# Patient Record
Sex: Male | Born: 1960 | Race: White | Hispanic: No | Smoking: Current every day smoker
Health system: Southern US, Community
[De-identification: ages and names within clinical notes are randomized; demographics above are authoritative.]

## PROBLEM LIST (undated history)

## (undated) DIAGNOSIS — I10 Essential (primary) hypertension: Secondary | ICD-10-CM

## (undated) DIAGNOSIS — F329 Major depressive disorder, single episode, unspecified: Secondary | ICD-10-CM

## (undated) DIAGNOSIS — I219 Acute myocardial infarction, unspecified: Secondary | ICD-10-CM

## (undated) DIAGNOSIS — F32A Depression, unspecified: Secondary | ICD-10-CM

## (undated) DIAGNOSIS — N19 Unspecified kidney failure: Secondary | ICD-10-CM

## (undated) DIAGNOSIS — E785 Hyperlipidemia, unspecified: Secondary | ICD-10-CM

## (undated) DIAGNOSIS — K859 Acute pancreatitis without necrosis or infection, unspecified: Secondary | ICD-10-CM

## (undated) DIAGNOSIS — F419 Anxiety disorder, unspecified: Secondary | ICD-10-CM

## (undated) DIAGNOSIS — I2510 Atherosclerotic heart disease of native coronary artery without angina pectoris: Secondary | ICD-10-CM

## (undated) DIAGNOSIS — E1149 Type 2 diabetes mellitus with other diabetic neurological complication: Secondary | ICD-10-CM

## (undated) HISTORY — DX: Anxiety disorder, unspecified: F41.9

## (undated) HISTORY — PX: CYSTOSCOPY W/ STONE MANIPULATION: SHX1427

## (undated) HISTORY — PX: CORONARY ANGIOPLASTY WITH STENT PLACEMENT: SHX49

## (undated) HISTORY — PX: CHOLECYSTECTOMY OPEN: SUR202

## (undated) HISTORY — DX: Type 2 diabetes mellitus with other diabetic neurological complication: E11.49

---

## 2015-10-23 ENCOUNTER — Emergency Department (HOSPITAL_COMMUNITY)
Admission: EM | Admit: 2015-10-23 | Discharge: 2015-10-23 | Disposition: A | Discharge status: Home / Self Care | Payer: Medicare Other | Attending: Emergency Medicine | Admitting: Emergency Medicine

## 2015-10-23 ENCOUNTER — Emergency Department (HOSPITAL_COMMUNITY): Payer: Medicare Other

## 2015-10-23 ENCOUNTER — Encounter (HOSPITAL_COMMUNITY): Payer: Self-pay | Admitting: *Deleted

## 2015-10-23 DIAGNOSIS — F17210 Nicotine dependence, cigarettes, uncomplicated: Secondary | ICD-10-CM | POA: Insufficient documentation

## 2015-10-23 DIAGNOSIS — I10 Essential (primary) hypertension: Secondary | ICD-10-CM | POA: Insufficient documentation

## 2015-10-23 DIAGNOSIS — R109 Unspecified abdominal pain: Secondary | ICD-10-CM | POA: Insufficient documentation

## 2015-10-23 DIAGNOSIS — Z794 Long term (current) use of insulin: Secondary | ICD-10-CM | POA: Diagnosis not present

## 2015-10-23 DIAGNOSIS — Z9119 Patient's noncompliance with other medical treatment and regimen: Secondary | ICD-10-CM | POA: Diagnosis not present

## 2015-10-23 DIAGNOSIS — E1165 Type 2 diabetes mellitus with hyperglycemia: Secondary | ICD-10-CM | POA: Insufficient documentation

## 2015-10-23 DIAGNOSIS — G629 Polyneuropathy, unspecified: Secondary | ICD-10-CM | POA: Insufficient documentation

## 2015-10-23 DIAGNOSIS — I252 Old myocardial infarction: Secondary | ICD-10-CM | POA: Insufficient documentation

## 2015-10-23 DIAGNOSIS — Z8719 Personal history of other diseases of the digestive system: Secondary | ICD-10-CM | POA: Insufficient documentation

## 2015-10-23 DIAGNOSIS — Z79899 Other long term (current) drug therapy: Secondary | ICD-10-CM | POA: Diagnosis not present

## 2015-10-23 DIAGNOSIS — Z9114 Patient's other noncompliance with medication regimen: Secondary | ICD-10-CM

## 2015-10-23 HISTORY — DX: Acute myocardial infarction, unspecified: I21.9

## 2015-10-23 HISTORY — DX: Acute pancreatitis without necrosis or infection, unspecified: K85.90

## 2015-10-23 HISTORY — DX: Essential (primary) hypertension: I10

## 2015-10-23 LAB — CBC
HEMATOCRIT: 38.9 % — AB (ref 39.0–52.0)
Hemoglobin: 14.3 g/dL (ref 13.0–17.0)
MCH: 30.1 pg (ref 26.0–34.0)
MCHC: 36.8 g/dL — ABNORMAL HIGH (ref 30.0–36.0)
MCV: 81.9 fL (ref 78.0–100.0)
PLATELETS: ADEQUATE 10*3/uL (ref 150–400)
RBC: 4.75 MIL/uL (ref 4.22–5.81)
RDW: 13 % (ref 11.5–15.5)
WBC: 5.1 10*3/uL (ref 4.0–10.5)

## 2015-10-23 LAB — BLOOD GAS, ARTERIAL
Acid-base deficit: 2 mmol/L (ref 0.0–2.0)
BICARBONATE: 23 meq/L (ref 20.0–24.0)
Drawn by: 331471
O2 SAT: 91.2 %
PATIENT TEMPERATURE: 98.6
PO2 ART: 62.7 mmHg — AB (ref 80.0–100.0)
TCO2: 20.8 mmol/L (ref 0–100)
pCO2 arterial: 42.6 mmHg (ref 35.0–45.0)
pH, Arterial: 7.352 (ref 7.350–7.450)

## 2015-10-23 LAB — URINALYSIS, ROUTINE W REFLEX MICROSCOPIC
Bilirubin Urine: NEGATIVE
Glucose, UA: >1000 mg/dL — AB
Hgb urine dipstick: NEGATIVE
KETONES UR: NEGATIVE mg/dL
LEUKOCYTES UA: NEGATIVE
NITRITE: NEGATIVE
PROTEIN: NEGATIVE mg/dL
Specific Gravity, Urine: 1.034 — ABNORMAL HIGH (ref 1.005–1.030)
pH: 6 (ref 5.0–8.0)

## 2015-10-23 LAB — BASIC METABOLIC PANEL
ANION GAP: 15 (ref 5–15)
BUN: 19 mg/dL (ref 6–20)
CALCIUM: 9 mg/dL (ref 8.9–10.3)
CO2: 18 mmol/L — ABNORMAL LOW (ref 22–32)
Chloride: 91 mmol/L — ABNORMAL LOW (ref 101–111)
Creatinine, Ser: 1.31 mg/dL — ABNORMAL HIGH (ref 0.61–1.24)
GFR, EST NON AFRICAN AMERICAN: 60 mL/min — AB (ref >60)
Glucose, Bld: 1065 mg/dL (ref 65–99)
POTASSIUM: 4.6 mmol/L (ref 3.5–5.1)
SODIUM: 124 mmol/L — AB (ref 135–145)

## 2015-10-23 LAB — CBG MONITORING, ED
GLUCOSE-CAPILLARY: 474 mg/dL — AB (ref 65–99)
Glucose-Capillary: >600 mg/dL (ref 65–99)
Glucose-Capillary: >600 mg/dL (ref 65–99)
Glucose-Capillary: 304 mg/dL — ABNORMAL HIGH (ref 65–99)

## 2015-10-23 LAB — LIPASE, BLOOD: Lipase: 16 U/L (ref 11–51)

## 2015-10-23 LAB — BETA-HYDROXYBUTYRIC ACID: Beta-Hydroxybutyric Acid: 0.3 mmol/L — ABNORMAL HIGH (ref 0.05–0.27)

## 2015-10-23 LAB — URINE MICROSCOPIC-ADD ON: RBC / HPF: NONE SEEN RBC/hpf (ref 0–5)

## 2015-10-23 LAB — TROPONIN I: Troponin I: <0.03 ng/mL (ref <0.031)

## 2015-10-23 MED ORDER — CARVEDILOL 3.125 MG PO TABS: 3.1250 mg | ORAL_TABLET | Freq: Two times a day (BID) | ORAL | Status: AC

## 2015-10-23 MED ORDER — NOVOLOG FLEXPEN 100 UNIT/ML ~~LOC~~ SOPN: PEN_INJECTOR | SUBCUTANEOUS | Status: DC

## 2015-10-23 MED ORDER — ONDANSETRON HCL 4 MG/2ML IJ SOLN
4.0000 mg | Freq: Once | INTRAMUSCULAR | Status: AC
  Administered 2015-10-23: 4 mg via INTRAVENOUS
  Filled 2015-10-23: qty 2

## 2015-10-23 MED ORDER — MORPHINE SULFATE (PF) 4 MG/ML IV SOLN
4.0000 mg | Freq: Once | INTRAVENOUS | Status: AC
  Administered 2015-10-23: 4 mg via INTRAVENOUS
  Filled 2015-10-23: qty 1

## 2015-10-23 MED ORDER — INSULIN ASPART 100 UNIT/ML IV SOLN
10.0000 [IU] | Freq: Once | INTRAVENOUS | Status: DC
  Filled 2015-10-23: qty 0.1

## 2015-10-23 MED ORDER — LANTUS SOLOSTAR 100 UNIT/ML ~~LOC~~ SOPN: 70.0000 [IU] | PEN_INJECTOR | Freq: Every day | SUBCUTANEOUS | Status: DC

## 2015-10-23 MED ORDER — PANTOPRAZOLE SODIUM 40 MG PO TBEC: 40.0000 mg | DELAYED_RELEASE_TABLET | Freq: Every day | ORAL | Status: AC

## 2015-10-23 MED ORDER — GABAPENTIN 300 MG PO CAPS: 900.0000 mg | ORAL_CAPSULE | Freq: Three times a day (TID) | ORAL | Status: AC

## 2015-10-23 MED ORDER — HYDROCODONE-ACETAMINOPHEN 5-325 MG PO TABS
1.0000 | ORAL_TABLET | Freq: Once | ORAL | Status: AC
  Administered 2015-10-23: 1 via ORAL
  Filled 2015-10-23: qty 1

## 2015-10-23 MED ORDER — FENOFIBRATE 145 MG PO TABS: 145.0000 mg | ORAL_TABLET | Freq: Every morning | ORAL | Status: AC

## 2015-10-23 MED ORDER — SODIUM CHLORIDE 0.9 % IV BOLUS (SEPSIS)
1000.0000 mL | Freq: Once | INTRAVENOUS | Status: AC
  Administered 2015-10-23: 1000 mL via INTRAVENOUS

## 2015-10-23 MED ORDER — SODIUM CHLORIDE 0.9 % IV SOLN
INTRAVENOUS | Status: DC
  Administered 2015-10-23: 5.4 [IU]/h via INTRAVENOUS
  Administered 2015-10-23: 10 [IU]/h via INTRAVENOUS
  Filled 2015-10-23: qty 2.5

## 2015-10-23 MED ORDER — INSULIN ASPART 100 UNIT/ML IV SOLN: 10.0000 [IU] | Freq: Once | INTRAVENOUS | Status: DC

## 2015-10-23 MED ORDER — ROSUVASTATIN CALCIUM 40 MG PO TABS: 40.0000 mg | ORAL_TABLET | Freq: Every day | ORAL | Status: AC

## 2015-10-23 MED ORDER — HYDROCODONE-ACETAMINOPHEN 5-325 MG PO TABS: 2.0000 | ORAL_TABLET | ORAL | Status: DC | PRN

## 2015-10-23 NOTE — ED Notes (Signed)
Bed: [id number] Expected date:  Expected time:  Means of arrival:  Comments: EMS- hyperglyemia > 600

## 2015-10-23 NOTE — ED Provider Notes (Signed)
CSN: [id number]     Arrival date & time 10/23/15  1419 History   First MD Initiated Contact with Patient 10/23/15 1510     Chief Complaint  Patient presents with  . Hyperglycemia   PT REPORTS HIGH BLOOD SUGAR.  PT SAID THAT HE JUST MOVED HERE A WEEK AGO AND DOES NOT HAVE A PCP.  THE PT SAID THAT HE FREQUENTLY HAS ELEVATED BS.  PT HAD BS OVER 600 AT HOME.  HE DID NOT TAKE ANY EXTRA MEDS, BUT DID SAY HE TOOK HIS MEDS TODAY.  PT ALSO C/O ABD PAIN WHICH IS CHRONIC.  NOTHING NEW ABOUT THE PAIN TODAY.  PT C/O PAIN IN HIS LEGS WHICH IS ALSO CHRONIC FROM NEUROPATHY.  PT ALSO C/O A WART ON HIS RIGHT FOOT THAT HE HAS HAD FOR 3 YEARS.  THIS IS ALSO CAUSING HIM CHRONIC PAIN.   (Consider location/radiation/quality/duration/timing/severity/associated sxs/prior Treatment) The history is provided by the patient.    Past Medical History  Diagnosis Date  . Diabetes mellitus without complication (HCC)   . Hypertension   . Myocardial infarction acute (HCC) 2015  . Pancreatitis    Past Surgical History  Procedure Laterality Date  . Cholecystectomy     No family history on file. Social History  Substance Use Topics  . Smoking status: Current Every Day Smoker    Types: Cigarettes  . Smokeless tobacco: None  . Alcohol Use: No    Review of Systems  Gastrointestinal: Positive for abdominal pain.  Endocrine: Positive for polydipsia and polyuria.  Musculoskeletal:       BILATERAL LEG PAIN (NEUROPATHY) AND RIGHT FOOT PAIN   All other systems reviewed and are negative.     Allergies  Review of patient's allergies indicates no known allergies.  Home Medications   Prior to Admission medications   Medication Sig Start Date End Date Taking? Authorizing Provider  carvedilol (COREG) 3.125 MG tablet Take 1 tablet (3.125 mg total) by mouth 2 (two) times daily. 10/23/15   Jacalyn Lefevre, MD  fenofibrate (TRICOR) 145 MG tablet Take 1 tablet (145 mg total) by mouth every morning. 10/23/15   Jacalyn Lefevre,  MD  gabapentin (NEURONTIN) 300 MG capsule Take 3 capsules (900 mg total) by mouth 3 (three) times daily. 10/23/15   Jacalyn Lefevre, MD  HYDROcodone-acetaminophen (NORCO/VICODIN) 5-325 MG tablet Take 2 tablets by mouth every 4 (four) hours as needed. 10/23/15   Jacalyn Lefevre, MD  LANTUS SOLOSTAR 100 UNIT/ML Solostar Pen Inject 70 Units into the skin daily at 10 pm. 10/23/15   Jacalyn Lefevre, MD  NOVOLOG FLEXPEN 100 UNIT/ML FlexPen USE AS RX'D BY PCP 10/23/15   Jacalyn Lefevre, MD  pantoprazole (PROTONIX) 40 MG tablet Take 1 tablet (40 mg total) by mouth at bedtime. 10/23/15   Jacalyn Lefevre, MD  rosuvastatin (CRESTOR) 40 MG tablet Take 1 tablet (40 mg total) by mouth at bedtime. 10/23/15   Jacalyn Lefevre, MD   BP 112/79 mmHg  Pulse 103  Temp(Src) 98.2 F (36.8 C) (Oral)  Resp 17  Ht  (1.93 m)  Wt 190 lb (86.183 kg)  BMI 23.14 kg/m2  SpO2 98% Physical Exam  Constitutional: He is oriented to person, place, and time. He appears well-developed and well-nourished.  HENT:  Head: Normocephalic and atraumatic.  Right Ear: External ear normal.  Left Ear: External ear normal.  Mouth/Throat: Mucous membranes are dry.  Eyes: Conjunctivae are normal. Pupils are equal, round, and reactive to light.  Neck: Normal range of motion. Neck supple.  Cardiovascular: Normal rate, regular rhythm and normal heart sounds.   Pulmonary/Chest: Effort normal and breath sounds normal.  Abdominal: Soft. There is tenderness.  Musculoskeletal: Normal range of motion.  Neurological: He is alert and oriented to person, place, and time.  Skin: Skin is warm and dry.  Psychiatric: He has a normal mood and affect. His behavior is normal. Thought content normal.  Nursing note and vitals reviewed.   ED Course  Procedures (including critical care time) Labs Review Labs Reviewed  BASIC METABOLIC PANEL - Abnormal; Notable for the following:    Sodium 124 (*)    Chloride 91 (*)    CO2 18 (*)    Glucose, Bld 1065 (*)     Creatinine, Ser 1.31 (*)    GFR calc non Af Amer 60 (*)    All other components within normal limits  URINALYSIS, ROUTINE W REFLEX MICROSCOPIC (NOT AT [facility]) - Abnormal; Notable for the following:    Specific Gravity, Urine 1.034 (*)    Glucose, UA >1000 (*)    All other components within normal limits  BLOOD GAS, ARTERIAL - Abnormal; Notable for the following:    pO2, Arterial 62.7 (*)    All other components within normal limits  BETA-HYDROXYBUTYRIC ACID - Abnormal; Notable for the following:    Beta-Hydroxybutyric Acid 0.30 (*)    All other components within normal limits  URINE MICROSCOPIC-ADD ON - Abnormal; Notable for the following:    Squamous Epithelial / LPF 0-5 (*)    Bacteria, UA RARE (*)    All other components within normal limits  CBC - Abnormal; Notable for the following:    HCT 38.9 (*)    MCHC 36.8 (*)    All other components within normal limits  CBG MONITORING, ED - Abnormal; Notable for the following:    Glucose-Capillary >600 (*)    All other components within normal limits  CBG MONITORING, ED - Abnormal; Notable for the following:    Glucose-Capillary >600 (*)    All other components within normal limits  CBG MONITORING, ED - Abnormal; Notable for the following:    Glucose-Capillary >600 (*)    All other components within normal limits  CBG MONITORING, ED - Abnormal; Notable for the following:    Glucose-Capillary 474 (*)    All other components within normal limits  CBG MONITORING, ED - Abnormal; Notable for the following:    Glucose-Capillary 304 (*)    All other components within normal limits  LIPASE, BLOOD  TROPONIN I    Imaging Review Dg Abd Acute W/chest  10/23/2015  CLINICAL DATA:  Increasing right lower quadrant pain. EXAM: DG ABDOMEN ACUTE W/ 1V CHEST COMPARISON:  None. FINDINGS: Surgical clips and coils near the midline of the upper abdomen. Moderate stool burden throughout the colon. Nonobstructive bowel gas pattern. No free air organomegaly.  No suspicious calcification. Heart and mediastinal contours are within normal limits. No focal opacities or effusions. No acute bony abnormality. IMPRESSION: Moderate stool burden.  No obstruction or free air. No active cardiopulmonary disease. Electronically Signed   By: Charlett NoseKevin  Dover M.D.   On: 10/23/2015 15:58   I have personally reviewed and evaluated these images and lab results as part of my medical decision-making.   EKG Interpretation   Date/Time:  Wednesday October 23 2015 16:03:29 EDT Ventricular Rate:  70 PR Interval:  163 QRS Duration: 110 QT Interval:  410 QTC Calculation: 442 R Axis:   79 Text Interpretation:  Sinus rhythm Anterior infarct,  old Minimal ST  depression, lateral leads Confirmed by [facility] MD, Maybell Misenheimer ([id number]) on  10/23/2015 4:23:42 PM      MDM  BS MUCH BETTER.  PT FEELS BETTER. Final diagnoses:  Type 2 diabetes mellitus with hyperglycemia, with long-term current use of insulin (HCC)  Noncompliance with medications        Jacalyn Lefevre, MD 10/23/15 [phone]

## 2015-10-23 NOTE — ED Notes (Signed)
Patient is from home. He called 911 today because he was not feeling well. Patient has a diagnosis of DM, but is poorly educated in regards to insulin and the disease process. Patient says his blood sugar was almost 600 last night, he took his insulin, but then ate a bowl of cherrios. Today he just still feels really, bad with abdominal pain. He read 600 on the EMS meter, but is likely higher.
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
## 2015-10-23 NOTE — ED Notes (Signed)
RT at bedside for blood gas

## 2015-10-23 NOTE — Discharge Instructions (Signed)
Blood Glucose Monitoring, Adult ° °Monitoring your blood glucose (also know as blood sugar) helps you to manage your diabetes. It also helps you and your health care provider monitor your diabetes and determine how well your treatment plan is working. °WHY SHOULD YOU MONITOR YOUR BLOOD GLUCOSE? °· It can help you understand how food, exercise, and medicine affect your blood glucose. °· It allows you to know what your blood glucose is at any given moment. You can quickly tell if you are having low blood glucose (hypoglycemia) or high blood glucose (hyperglycemia). °· It can help you and your health care provider know how to adjust your medicines. °· It can help you understand how to manage an illness or adjust medicine for exercise. °WHEN SHOULD YOU TEST? °Your health care provider will help you decide how often you should check your blood glucose. This may depend on the type of diabetes you have, your diabetes control, or the types of medicines you are taking. Be sure to write down all of your blood glucose readings so that this information can be reviewed with your health care provider. See below for examples of testing times that your health care provider may suggest. °Type 1 Diabetes °· Test at least 2 times per day if your diabetes is well controlled, if you are using an insulin pump, or if you perform multiple daily injections. °· If your diabetes is not well controlled or if you are sick, you may need to test more often. °· It is a good idea to also test: °¨ Before every insulin injection. °¨ Before and after exercise. °¨ Between meals and 2 hours after a meal. °¨ Occasionally between 2:00 a.m. and 3:00 a.m. °Type 2 Diabetes °· If you are taking insulin, test at least 2 times per day. However, it is best to test before every insulin injection. °· If you take medicines by mouth (orally), test 2 times a day. °· If you are on a controlled diet, test once a day. °· If your diabetes is not well controlled or if you  are sick, you may need to monitor more often. °HOW TO MONITOR YOUR BLOOD GLUCOSE °Supplies Needed °· Blood glucose meter. °· Test strips for your meter. Each meter has its own strips. You must use the strips that go with your own meter. °· A pricking needle (lancet). °· A device that holds the lancet (lancing device). °· A journal or log book to write down your results. °Procedure °· Wash your hands with soap and water. Alcohol is not preferred. °· Prick the side of your finger (not the tip) with the lancet. °· Gently milk the finger until a small drop of blood appears. °· Follow the instructions that come with your meter for inserting the test strip, applying blood to the strip, and using your blood glucose meter. °Other Areas to Get Blood for Testing °Some meters allow you to use other areas of your body (other than your finger) to test your blood. These areas are called alternative sites. The most common alternative sites are: °· The forearm. °· The thigh. °· The back area of the lower leg. °· The palm of the hand. °The blood flow in these areas is slower. Therefore, the blood glucose values you get may be delayed, and the numbers are different from what you would get from your fingers. Do not use alternative sites if you think you are having hypoglycemia. Your reading will not be accurate. Always use a finger if you   are having hypoglycemia. Also, if you cannot feel your lows (hypoglycemia unawareness), always use your fingers for your blood glucose checks. °ADDITIONAL TIPS FOR GLUCOSE MONITORING °· Do not reuse lancets. °· Always carry your supplies with you. °· All blood glucose meters have a 24-hour "hotline" number to call if you have questions or need help. °· Adjust (calibrate) your blood glucose meter with a control solution after finishing a few boxes of strips. °BLOOD GLUCOSE RECORD KEEPING °It is a good idea to keep a daily record or log of your blood glucose readings. Most glucose meters, if not all,  keep your glucose records stored in the meter. Some meters come with the ability to download your records to your home computer. Keeping a record of your blood glucose readings is especially helpful if you are wanting to look for patterns. Make notes to go along with the blood glucose readings because you might forget what happened at that exact time. Keeping good records helps you and your health care provider to work together to achieve good diabetes management.  °  °This information is not intended to replace advice given to you by your health care provider. Make sure you discuss any questions you have with your health care provider. °  °Document Released: 06/18/2003 Document Revised: 07/06/2014 Document Reviewed: 11/07/2012 °Elsevier Interactive Patient Education ©2016 Elsevier Inc. ° °

## 2015-10-23 NOTE — ED Notes (Addendum)
Chris from lab critical glucose 1065. Primary nurse notified of same.

## 2015-10-23 NOTE — ED Notes (Signed)
Sarah from lab, reports "platelet appears adequate".

## 2015-10-24 ENCOUNTER — Telehealth: Payer: Self-pay

## 2015-10-24 NOTE — Telephone Encounter (Signed)
[facility]:  This Case Manager received communication from Radford PaxAmy Ferrero, RN CM [facility] indicating patient new to the area and needing an ED follow-up appointment for type 2 diabetes mellitus with hyperglycemia. Call placed to #[phone]; unable to reach patient. Also unable to leave voicemail as recording indicated "subscriber not available."

## 2015-10-25 ENCOUNTER — Telehealth: Payer: Self-pay | Admitting: General Practice

## 2015-11-04 ENCOUNTER — Telehealth: Payer: Self-pay
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
## 2015-11-05 ENCOUNTER — Telehealth: Payer: Self-pay

## 2015-11-05 NOTE — Telephone Encounter (Signed)
Attempted again to reach the patient to inquire if he needs transportation [facility].  Call placed to # [phone] and the message states that the phone does not accept incoming calls at the subscriber's request.

## 2015-11-05 NOTE — Telephone Encounter (Signed)
Attempted to contact the patient to confirm his appointment and need for cab transportation to the clinic. Calls placed to # [phone]. Twice the call was dropped after 7 rings and then the third time the message stated that " the subscriber is not accepting incoming calls ."

## 2015-11-06 ENCOUNTER — Encounter: Payer: Self-pay | Admitting: Internal Medicine

## 2015-11-06 ENCOUNTER — Ambulatory Visit: Payer: Medicare (Managed Care) | Attending: Internal Medicine | Admitting: Internal Medicine

## 2015-11-06 ENCOUNTER — Other Ambulatory Visit: Payer: Self-pay | Admitting: *Deleted

## 2015-11-06 VITALS — BP 115/79 | HR 94 | Temp 98.3°F | Resp 18 | Ht 76.0 in | Wt 191.0 lb

## 2015-11-06 DIAGNOSIS — G8929 Other chronic pain: Secondary | ICD-10-CM | POA: Insufficient documentation

## 2015-11-06 DIAGNOSIS — Z9119 Patient's noncompliance with other medical treatment and regimen: Secondary | ICD-10-CM | POA: Insufficient documentation

## 2015-11-06 DIAGNOSIS — IMO0002 Reserved for concepts with insufficient information to code with codable children: Secondary | ICD-10-CM | POA: Insufficient documentation

## 2015-11-06 DIAGNOSIS — Z794 Long term (current) use of insulin: Secondary | ICD-10-CM | POA: Diagnosis not present

## 2015-11-06 DIAGNOSIS — E119 Type 2 diabetes mellitus without complications: Secondary | ICD-10-CM

## 2015-11-06 DIAGNOSIS — I10 Essential (primary) hypertension: Secondary | ICD-10-CM | POA: Insufficient documentation

## 2015-11-06 DIAGNOSIS — B070 Plantar wart: Secondary | ICD-10-CM | POA: Insufficient documentation

## 2015-11-06 DIAGNOSIS — E1165 Type 2 diabetes mellitus with hyperglycemia: Secondary | ICD-10-CM | POA: Diagnosis not present

## 2015-11-06 DIAGNOSIS — F411 Generalized anxiety disorder: Secondary | ICD-10-CM | POA: Insufficient documentation

## 2015-11-06 DIAGNOSIS — G629 Polyneuropathy, unspecified: Secondary | ICD-10-CM | POA: Insufficient documentation

## 2015-11-06 DIAGNOSIS — I252 Old myocardial infarction: Secondary | ICD-10-CM | POA: Diagnosis not present

## 2015-11-06 DIAGNOSIS — E1142 Type 2 diabetes mellitus with diabetic polyneuropathy: Secondary | ICD-10-CM

## 2015-11-06 DIAGNOSIS — R1031 Right lower quadrant pain: Secondary | ICD-10-CM | POA: Diagnosis not present

## 2015-11-06 DIAGNOSIS — M792 Neuralgia and neuritis, unspecified: Secondary | ICD-10-CM | POA: Insufficient documentation

## 2015-11-06 LAB — GLUCOSE, POCT (MANUAL RESULT ENTRY)
POC GLUCOSE: 290 mg/dL — AB (ref 70–99)
POC Glucose: 193 mg/dl — AB (ref 70–99)

## 2015-11-06 LAB — HEMOGLOBIN A1C
HEMOGLOBIN A1C: 14.4 % — AB (ref <5.7)
MEAN PLASMA GLUCOSE: 367 mg/dL

## 2015-11-06 MED ORDER — AMITRIPTYLINE HCL 25 MG PO TABS: 25.0000 mg | ORAL_TABLET | Freq: Every day | ORAL | Status: DC

## 2015-11-06 MED ORDER — AMITRIPTYLINE HCL 25 MG PO TABS: 25.0000 mg | ORAL_TABLET | Freq: Every day | ORAL | Status: AC

## 2015-11-06 MED ORDER — INSULIN ASPART 100 UNIT/ML ~~LOC~~ SOLN
10.0000 [IU] | Freq: Once | SUBCUTANEOUS | Status: AC
  Administered 2015-11-06: 10 [IU] via SUBCUTANEOUS

## 2015-11-06 MED ORDER — NOVOLOG FLEXPEN 100 UNIT/ML ~~LOC~~ SOPN: 20.0000 [IU] | PEN_INJECTOR | Freq: Three times a day (TID) | SUBCUTANEOUS | Status: DC

## 2015-11-06 NOTE — Assessment & Plan Note (Signed)
Reports residual pain symptoms from 2015 necrotizing (life threatening) pancreatitis Treatment as under periph neuropathy pain No other acute symptoms or GI symptoms such as N/V/D

## 2015-11-06 NOTE — Progress Notes (Signed)
Subjective:    Patient ID: Devin Wells, male    DOB: December 23, 1960, 55 y.o.   MRN: [id number]  HPI  Patient here for ED follow up and establish care [place], [place] with chronic medical conditions : DM, neuropathy, chronic pain in pain clinic (out of oxy x 3 weeks, prev  TID) Reports no change in chronic severe B leg/feet pain or RLQ pain since ED visit Out of all meds, not jut narcotics  Past Medical History  Diagnosis Date  . Diabetes with neurologic complications (HCC)     neuropathy  . Hypertension   . Myocardial infarction acute (HCC) 2015  . Pancreatitis   . Anxiety     Review of Systems  Constitutional: Positive for fatigue. Negative for fever, appetite change and unexpected weight change.  Respiratory: Negative for cough and shortness of breath.   Cardiovascular: Positive for leg swelling (intermittent, none today). Negative for chest pain and palpitations.  Gastrointestinal: Positive for abdominal pain (chronic RLQ). Negative for nausea and constipation. Diarrhea: occ.  Musculoskeletal: Positive for myalgias.  Neurological: Positive for numbness.       Objective:    Physical Exam  Constitutional: He is oriented to person, place, and time. He appears well-developed and well-nourished.  Anxious demeanor - son at side  Cardiovascular: Normal rate, regular rhythm and normal heart sounds.   Pulmonary/Chest: Effort normal and breath sounds normal.  Musculoskeletal: He exhibits no edema.  Neurological: He is alert and oriented to person, place, and time.  Skin: Skin is warm. No rash noted. No erythema.  L foot under MT pad with small 2mm wart - no other wound/ulcers/bunion or precallus  Psychiatric:  anxious    BP 115/79 mmHg  Pulse 94  Temp(Src) 98.3 F (36.8 C) (Oral)  Resp 18  Ht  (1.93 m)  Wt 191 lb (86.637 kg)  BMI 23.26 kg/m2  SpO2 97% Wt Readings from Last 3 Encounters:  11/06/15 191 lb  (86.637 kg)  10/23/15 190 lb (86.183 kg)     Lab Results  Component Value Date   WBC 5.1 10/23/2015   HGB 14.3 10/23/2015   HCT 38.9* 10/23/2015   PLT PLATELETS APPEAR ADEQUATE 10/23/2015   GLUCOSE 1065* 10/23/2015   NA 124* 10/23/2015   K 4.6 10/23/2015   CL 91* 10/23/2015   CREATININE 1.31* 10/23/2015   BUN 19 10/23/2015   CO2 18* 10/23/2015    Dg Abd Acute W/chest  10/23/2015  CLINICAL DATA:  Increasing right lower quadrant pain. EXAM: DG ABDOMEN ACUTE W/ 1V CHEST COMPARISON:  None. FINDINGS: Surgical clips and coils near the midline of the upper abdomen. Moderate stool burden throughout the colon. Nonobstructive bowel gas pattern. No free air organomegaly. No suspicious calcification. Heart and mediastinal contours are within normal limits. No focal opacities or effusions. No acute bony abnormality. IMPRESSION: Moderate stool burden.  No obstruction or free air. No active cardiopulmonary disease. Electronically Signed   By: Charlett Nose M.D.   On: 10/23/2015 15:58       Assessment & Plan:   Problem List Items Addressed This Visit    Abdominal pain, chronic, right lower quadrant    Reports residual pain symptoms from 2015 necrotizing (life threatening) pancreatitis Treatment as under periph neuropathy pain No other acute symptoms or GI symptoms such as N/V/D      Relevant Orders   Ambulatory referral to Pain Clinic   Diabetes type 2, uncontrolled (HCC) -  Primary    Complicated by noncompliance - ED visit with glc >1000 Instructed to take medications as listed to establish "baseline trends" before trying to make dose adjustments Refill on meds today - reports already has meter and supplies Check a1c today Refer to DM counseling and RTC 3 mo for recheck No results found for: HGBA1C       Relevant Medications   insulin aspart (novoLOG) injection 10 Units (Completed)   NOVOLOG FLEXPEN 100 UNIT/ML FlexPen   Other Relevant Orders   Glucose (CBG) (Completed)   Glucose  (CBG) (Completed)   Ambulatory referral to Podiatry   Generalized anxiety disorder    Exacerbated by "withdrawl" from oxy (none in past 3 weeks) Use amitrip qhs and behav support - now living with son rather than alone in [place] which pt reports is helping      Peripheral neuropathic pain (HCC)    Severe BLE symptoms - previously in pain clinic in [place] for this and chronic RLQ pain s/p nec panc in 2015 Continue gaba 900 TID and add Elavil 25mg  qhs Refer back to pain clinic to consider resuming Oxy as needed (prev 30mg  TID which will not be managed or refilled at this clinic)      Relevant Orders   Ambulatory referral to Pain Clinic   Ambulatory referral to Podiatry   Plantar wart of left foot    Refer podiatry given pain associated with same and uncontrolled DM      Relevant Orders   Ambulatory referral to Podiatry    Other Visit Diagnoses    Type 2 diabetes mellitus without complication, unspecified long term insulin use status (HCC)        Relevant Medications    insulin aspart (novoLOG) injection 10 Units (Completed)    NOVOLOG FLEXPEN 100 UNIT/ML FlexPen    Other Relevant Orders    Hemoglobin A1c        Rene PaciValerie Bohdi Leeds, MD

## 2015-11-06 NOTE — Progress Notes (Signed)
Patient is here for HFU: DM  Patient complains of pain in the abdomen scaled currently at a 10. Patient has Diabetic Neuropathy which is a constant pain and increases at night.  Patient has not taken medication today and patient has not eaten.  Patient tolerated 10 units of insulin well today.

## 2015-11-06 NOTE — Assessment & Plan Note (Signed)
Complicated by noncompliance - ED visit with glc >1000 Instructed to take medications as listed to establish "baseline trends" before trying to make dose adjustments Refill on meds today - reports already has meter and supplies Check a1c today Refer to DM counseling and RTC 3 mo for recheck No results found for: HGBA1C

## 2015-11-06 NOTE — Assessment & Plan Note (Signed)
Refer podiatry given pain associated with same and uncontrolled DM

## 2015-11-06 NOTE — Patient Instructions (Signed)
It was good to see you today.  We have reviewed your prior records including labs and tests today  Test(s) ordered today. Your results will be released to MyChart (or called to you) after review, usually within 72hours after test completion. If any changes need to be made, you will be notified at that same time.  Medications reviewed and updated Start amitriptyline 25mg  at night for pain in addition to your other medications - no other changes recommended at this time.  This clinic will not fill your narcotics so will refer to pain clinic as requested. Our office will contact you regarding appointment(s) once made.  we'll make referral to podiatry for your wart pain and names given for dentist . Our office will contact you regarding appointment(s) once made.  Please schedule followup in 3-4 months for diabetes mellitus recheck, call sooner if problems.

## 2015-11-06 NOTE — Assessment & Plan Note (Signed)
Exacerbated by "withdrawl" from oxy (none in past 3 weeks) Use amitrip qhs and behav support - now living with son rather than alone in [place] which pt reports is helping

## 2015-11-06 NOTE — Assessment & Plan Note (Signed)
Severe BLE symptoms - previously in pain clinic in [place] for this and chronic RLQ pain s/p nec panc in 2015 Continue gaba 900 TID and add Elavil 25mg  qhs Refer back to pain clinic to consider resuming Oxy as needed (prev 30mg  TID which will not be managed or refilled at this clinic)

## 2015-11-14 ENCOUNTER — Telehealth: Payer: Self-pay | Admitting: Internal Medicine

## 2015-11-14 NOTE — Telephone Encounter (Signed)
Pt is calling regarding a refill of Amtriptyline 25 mg  Patient use [facility] at [address]  Thank you

## 2015-11-19 ENCOUNTER — Observation Stay (HOSPITAL_COMMUNITY)
Admission: EM | Admit: 2015-11-19 | Discharge: 2015-11-21 | Disposition: A | Discharge status: Home / Self Care | Payer: Medicare Other | Attending: Internal Medicine | Admitting: Internal Medicine

## 2015-11-19 ENCOUNTER — Telehealth: Payer: Self-pay | Admitting: Internal Medicine

## 2015-11-19 ENCOUNTER — Encounter (HOSPITAL_COMMUNITY): Payer: Self-pay | Admitting: Emergency Medicine

## 2015-11-19 DIAGNOSIS — D696 Thrombocytopenia, unspecified: Secondary | ICD-10-CM | POA: Insufficient documentation

## 2015-11-19 DIAGNOSIS — I2510 Atherosclerotic heart disease of native coronary artery without angina pectoris: Secondary | ICD-10-CM | POA: Insufficient documentation

## 2015-11-19 DIAGNOSIS — Z955 Presence of coronary angioplasty implant and graft: Secondary | ICD-10-CM | POA: Diagnosis not present

## 2015-11-19 DIAGNOSIS — E039 Hypothyroidism, unspecified: Secondary | ICD-10-CM | POA: Diagnosis present

## 2015-11-19 DIAGNOSIS — I10 Essential (primary) hypertension: Secondary | ICD-10-CM | POA: Diagnosis not present

## 2015-11-19 DIAGNOSIS — E785 Hyperlipidemia, unspecified: Secondary | ICD-10-CM | POA: Diagnosis not present

## 2015-11-19 DIAGNOSIS — IMO0002 Reserved for concepts with insufficient information to code with codable children: Secondary | ICD-10-CM | POA: Diagnosis present

## 2015-11-19 DIAGNOSIS — R0789 Other chest pain: Secondary | ICD-10-CM | POA: Diagnosis not present

## 2015-11-19 DIAGNOSIS — I252 Old myocardial infarction: Secondary | ICD-10-CM | POA: Insufficient documentation

## 2015-11-19 DIAGNOSIS — E1165 Type 2 diabetes mellitus with hyperglycemia: Secondary | ICD-10-CM | POA: Diagnosis present

## 2015-11-19 DIAGNOSIS — G629 Polyneuropathy, unspecified: Secondary | ICD-10-CM | POA: Diagnosis present

## 2015-11-19 DIAGNOSIS — D649 Anemia, unspecified: Secondary | ICD-10-CM | POA: Diagnosis not present

## 2015-11-19 DIAGNOSIS — E781 Pure hyperglyceridemia: Secondary | ICD-10-CM | POA: Diagnosis not present

## 2015-11-19 DIAGNOSIS — Z794 Long term (current) use of insulin: Secondary | ICD-10-CM | POA: Diagnosis not present

## 2015-11-19 DIAGNOSIS — F411 Generalized anxiety disorder: Secondary | ICD-10-CM | POA: Diagnosis not present

## 2015-11-19 DIAGNOSIS — F1210 Cannabis abuse, uncomplicated: Secondary | ICD-10-CM | POA: Insufficient documentation

## 2015-11-19 DIAGNOSIS — M792 Neuralgia and neuritis, unspecified: Secondary | ICD-10-CM

## 2015-11-19 DIAGNOSIS — R079 Chest pain, unspecified: Secondary | ICD-10-CM | POA: Diagnosis present

## 2015-11-19 DIAGNOSIS — R739 Hyperglycemia, unspecified: Secondary | ICD-10-CM

## 2015-11-19 DIAGNOSIS — Z9861 Coronary angioplasty status: Secondary | ICD-10-CM

## 2015-11-19 DIAGNOSIS — Z720 Tobacco use: Secondary | ICD-10-CM | POA: Diagnosis present

## 2015-11-19 DIAGNOSIS — E1140 Type 2 diabetes mellitus with diabetic neuropathy, unspecified: Secondary | ICD-10-CM | POA: Diagnosis not present

## 2015-11-19 DIAGNOSIS — K8591 Acute pancreatitis with uninfected necrosis, unspecified: Secondary | ICD-10-CM | POA: Diagnosis present

## 2015-11-19 DIAGNOSIS — Z8719 Personal history of other diseases of the digestive system: Secondary | ICD-10-CM

## 2015-11-19 DIAGNOSIS — F17210 Nicotine dependence, cigarettes, uncomplicated: Secondary | ICD-10-CM | POA: Insufficient documentation

## 2015-11-19 HISTORY — DX: Major depressive disorder, single episode, unspecified: F32.9

## 2015-11-19 HISTORY — DX: Atherosclerotic heart disease of native coronary artery without angina pectoris: I25.10

## 2015-11-19 HISTORY — DX: Depression, unspecified: F32.A

## 2015-11-19 HISTORY — DX: Hyperlipidemia, unspecified: E78.5

## 2015-11-19 HISTORY — DX: Unspecified kidney failure: N19

## 2015-11-19 LAB — CBG MONITORING, ED: Glucose-Capillary: 537 mg/dL — ABNORMAL HIGH (ref 65–99)

## 2015-11-19 MED ORDER — SODIUM CHLORIDE 0.9 % IV BOLUS (SEPSIS)
1000.0000 mL | Freq: Once | INTRAVENOUS | Status: AC
  Administered 2015-11-20: 1000 mL via INTRAVENOUS

## 2015-11-19 MED ORDER — HYDROMORPHONE HCL 1 MG/ML IJ SOLN
1.0000 mg | Freq: Once | INTRAMUSCULAR | Status: AC
  Administered 2015-11-20: 1 mg via INTRAVENOUS
  Filled 2015-11-19: qty 1

## 2015-11-19 MED ORDER — KETOROLAC TROMETHAMINE 30 MG/ML IJ SOLN
30.0000 mg | Freq: Once | INTRAMUSCULAR | Status: AC
  Administered 2015-11-20: 30 mg via INTRAVENOUS
  Filled 2015-11-19: qty 1

## 2015-11-19 MED ORDER — ONDANSETRON HCL 4 MG/2ML IJ SOLN
4.0000 mg | Freq: Once | INTRAMUSCULAR | Status: AC
  Administered 2015-11-20: 4 mg via INTRAVENOUS
  Filled 2015-11-19: qty 2

## 2015-11-19 NOTE — ED Notes (Signed)
Pt reports CP ongoing all day, worse intermittently, described as pressure. Worse with exertion, also noted SHOB with exertion. 324MG  ASA and 3SL ntrio on board. Hx MI and stent placement. Also c/o feet neuropathy and hyperglycemia.

## 2015-11-19 NOTE — Telephone Encounter (Signed)
Pt saw  Dr Felicity CoyerLeschber and he is requesting  Refill  of Amtriptyline 25 mg Patient use [facility] at [address]  Thank you

## 2015-11-20 ENCOUNTER — Encounter (HOSPITAL_COMMUNITY): Payer: Self-pay | Admitting: Internal Medicine

## 2015-11-20 ENCOUNTER — Emergency Department (HOSPITAL_COMMUNITY): Payer: Medicare Other

## 2015-11-20 ENCOUNTER — Observation Stay (HOSPITAL_BASED_OUTPATIENT_CLINIC_OR_DEPARTMENT_OTHER): Payer: Medicare Other

## 2015-11-20 DIAGNOSIS — Z794 Long term (current) use of insulin: Secondary | ICD-10-CM

## 2015-11-20 DIAGNOSIS — R079 Chest pain, unspecified: Secondary | ICD-10-CM | POA: Diagnosis not present

## 2015-11-20 DIAGNOSIS — I2510 Atherosclerotic heart disease of native coronary artery without angina pectoris: Secondary | ICD-10-CM

## 2015-11-20 DIAGNOSIS — E1142 Type 2 diabetes mellitus with diabetic polyneuropathy: Secondary | ICD-10-CM | POA: Diagnosis not present

## 2015-11-20 DIAGNOSIS — Z720 Tobacco use: Secondary | ICD-10-CM

## 2015-11-20 DIAGNOSIS — E039 Hypothyroidism, unspecified: Secondary | ICD-10-CM | POA: Diagnosis present

## 2015-11-20 DIAGNOSIS — Z9861 Coronary angioplasty status: Secondary | ICD-10-CM

## 2015-11-20 DIAGNOSIS — K8591 Acute pancreatitis with uninfected necrosis, unspecified: Secondary | ICD-10-CM | POA: Diagnosis present

## 2015-11-20 DIAGNOSIS — Z8719 Personal history of other diseases of the digestive system: Secondary | ICD-10-CM

## 2015-11-20 DIAGNOSIS — I10 Essential (primary) hypertension: Secondary | ICD-10-CM | POA: Diagnosis not present

## 2015-11-20 DIAGNOSIS — E785 Hyperlipidemia, unspecified: Secondary | ICD-10-CM | POA: Diagnosis present

## 2015-11-20 DIAGNOSIS — E1165 Type 2 diabetes mellitus with hyperglycemia: Secondary | ICD-10-CM

## 2015-11-20 DIAGNOSIS — F411 Generalized anxiety disorder: Secondary | ICD-10-CM

## 2015-11-20 LAB — CBC WITH DIFFERENTIAL/PLATELET
BASOS PCT: 0 %
Basophils Absolute: 0 10*3/uL (ref 0.0–0.1)
EOS ABS: 0 10*3/uL (ref 0.0–0.7)
EOS PCT: 1 %
HCT: 35.3 % — ABNORMAL LOW (ref 39.0–52.0)
HEMOGLOBIN: 11.7 g/dL — AB (ref 13.0–17.0)
Lymphocytes Relative: 21 %
Lymphs Abs: 0.9 10*3/uL (ref 0.7–4.0)
MCH: 29.5 pg (ref 26.0–34.0)
MCHC: 33.1 g/dL (ref 30.0–36.0)
MCV: 88.9 fL (ref 78.0–100.0)
MONOS PCT: 8 %
Monocytes Absolute: 0.4 10*3/uL (ref 0.1–1.0)
NEUTROS PCT: 70 %
Neutro Abs: 3.1 10*3/uL (ref 1.7–7.7)
PLATELETS: 126 10*3/uL — AB (ref 150–400)
RBC: 3.97 MIL/uL — AB (ref 4.22–5.81)
RDW: 14.4 % (ref 11.5–15.5)
WBC: 4.5 10*3/uL (ref 4.0–10.5)

## 2015-11-20 LAB — BASIC METABOLIC PANEL
ANION GAP: 11 (ref 5–15)
Anion gap: 9 (ref 5–15)
BUN: 14 mg/dL (ref 6–20)
BUN: 15 mg/dL (ref 6–20)
CALCIUM: 8.8 mg/dL — AB (ref 8.9–10.3)
CO2: 21 mmol/L — ABNORMAL LOW (ref 22–32)
CO2: 23 mmol/L (ref 22–32)
CREATININE: 0.9 mg/dL (ref 0.61–1.24)
Calcium: 8.5 mg/dL — ABNORMAL LOW (ref 8.9–10.3)
Chloride: 103 mmol/L (ref 101–111)
Chloride: 98 mmol/L — ABNORMAL LOW (ref 101–111)
Creatinine, Ser: 0.99 mg/dL (ref 0.61–1.24)
GFR calc Af Amer: >60 mL/min (ref >60)
GFR calc non Af Amer: >60 mL/min (ref >60)
Glucose, Bld: 201 mg/dL — ABNORMAL HIGH (ref 65–99)
Glucose, Bld: 524 mg/dL — ABNORMAL HIGH (ref 65–99)
POTASSIUM: 3.7 mmol/L (ref 3.5–5.1)
Potassium: 3.6 mmol/L (ref 3.5–5.1)
SODIUM: 130 mmol/L — AB (ref 135–145)
SODIUM: 135 mmol/L (ref 135–145)

## 2015-11-20 LAB — HEPATIC FUNCTION PANEL
ALBUMIN: 3.1 g/dL — AB (ref 3.5–5.0)
ALK PHOS: 67 U/L (ref 38–126)
ALT: 19 U/L (ref 17–63)
AST: 23 U/L (ref 15–41)
Bilirubin, Direct: <0.1 mg/dL — ABNORMAL LOW (ref 0.1–0.5)
TOTAL PROTEIN: 5.4 g/dL — AB (ref 6.5–8.1)
Total Bilirubin: 0.5 mg/dL (ref 0.3–1.2)

## 2015-11-20 LAB — I-STAT TROPONIN, ED
Troponin i, poc: 0 ng/mL (ref 0.00–0.08)
Troponin i, poc: 0 ng/mL (ref 0.00–0.08)

## 2015-11-20 LAB — RAPID URINE DRUG SCREEN, HOSP PERFORMED
Amphetamines: NOT DETECTED
BARBITURATES: NOT DETECTED
Benzodiazepines: NOT DETECTED
Cocaine: NOT DETECTED
Opiates: NOT DETECTED
TETRAHYDROCANNABINOL: POSITIVE — AB

## 2015-11-20 LAB — GLUCOSE, CAPILLARY
Glucose-Capillary: 185 mg/dL — ABNORMAL HIGH (ref 65–99)
Glucose-Capillary: 175 mg/dL — ABNORMAL HIGH (ref 65–99)

## 2015-11-20 LAB — CBC
HEMATOCRIT: 34 % — AB (ref 39.0–52.0)
HEMOGLOBIN: 11.3 g/dL — AB (ref 13.0–17.0)
MCH: 29.5 pg (ref 26.0–34.0)
MCHC: 33.2 g/dL (ref 30.0–36.0)
MCV: 88.8 fL (ref 78.0–100.0)
Platelets: 123 10*3/uL — ABNORMAL LOW (ref 150–400)
RBC: 3.83 MIL/uL — ABNORMAL LOW (ref 4.22–5.81)
RDW: 14.3 % (ref 11.5–15.5)
WBC: 5.9 10*3/uL (ref 4.0–10.5)

## 2015-11-20 LAB — BETA-HYDROXYBUTYRIC ACID: Beta-Hydroxybutyric Acid: 0.11 mmol/L (ref 0.05–0.27)

## 2015-11-20 LAB — TROPONIN I
Troponin I: <0.03 ng/mL (ref <0.031)
Troponin I: <0.03 ng/mL (ref <0.031)

## 2015-11-20 LAB — LIPASE, BLOOD: LIPASE: 11 U/L (ref 11–51)

## 2015-11-20 LAB — ECHOCARDIOGRAM COMPLETE
Height: 76 in
WEIGHTICAEL: 3120 [oz_av]

## 2015-11-20 LAB — URINALYSIS, ROUTINE W REFLEX MICROSCOPIC
BILIRUBIN URINE: NEGATIVE
HGB URINE DIPSTICK: NEGATIVE
KETONES UR: NEGATIVE mg/dL
Leukocytes, UA: NEGATIVE
Nitrite: NEGATIVE
PROTEIN: NEGATIVE mg/dL
Specific Gravity, Urine: 1.038 — ABNORMAL HIGH (ref 1.005–1.030)
pH: 6 (ref 5.0–8.0)

## 2015-11-20 LAB — CBG MONITORING, ED
GLUCOSE-CAPILLARY: 389 mg/dL — AB (ref 65–99)
GLUCOSE-CAPILLARY: 422 mg/dL — AB (ref 65–99)
Glucose-Capillary: 247 mg/dL — ABNORMAL HIGH (ref 65–99)
Glucose-Capillary: 312 mg/dL — ABNORMAL HIGH (ref 65–99)

## 2015-11-20 LAB — URINE MICROSCOPIC-ADD ON
Bacteria, UA: NONE SEEN
Squamous Epithelial / LPF: NONE SEEN

## 2015-11-20 LAB — TYPE AND SCREEN
ABO/RH(D): O POS
ANTIBODY SCREEN: NEGATIVE

## 2015-11-20 LAB — ABO/RH: ABO/RH(D): O POS

## 2015-11-20 MED ORDER — OXYCODONE HCL 5 MG PO TABS
15.0000 mg | ORAL_TABLET | ORAL | Status: DC | PRN
  Administered 2015-11-20 – 2015-11-21 (×7): 15 mg via ORAL
  Filled 2015-11-20 (×7): qty 3

## 2015-11-20 MED ORDER — AMITRIPTYLINE HCL 25 MG PO TABS
25.0000 mg | ORAL_TABLET | Freq: Every day | ORAL | Status: DC
  Administered 2015-11-20: 25 mg via ORAL
  Filled 2015-11-20 (×2): qty 1

## 2015-11-20 MED ORDER — GABAPENTIN 300 MG PO CAPS
900.0000 mg | ORAL_CAPSULE | Freq: Three times a day (TID) | ORAL | Status: DC
  Administered 2015-11-20 – 2015-11-21 (×4): 900 mg via ORAL
  Filled 2015-11-20 (×4): qty 3

## 2015-11-20 MED ORDER — INSULIN ASPART 100 UNIT/ML ~~LOC~~ SOLN
20.0000 [IU] | Freq: Three times a day (TID) | SUBCUTANEOUS | Status: DC
  Administered 2015-11-20 – 2015-11-21 (×4): 20 [IU] via SUBCUTANEOUS
  Filled 2015-11-20: qty 1

## 2015-11-20 MED ORDER — PANTOPRAZOLE SODIUM 40 MG PO TBEC
40.0000 mg | DELAYED_RELEASE_TABLET | Freq: Every day | ORAL | Status: DC
  Administered 2015-11-20: 40 mg via ORAL
  Filled 2015-11-20: qty 1

## 2015-11-20 MED ORDER — CARVEDILOL 3.125 MG PO TABS
3.1250 mg | ORAL_TABLET | Freq: Two times a day (BID) | ORAL | Status: DC
  Administered 2015-11-20 – 2015-11-21 (×3): 3.125 mg via ORAL
  Filled 2015-11-20 (×3): qty 1

## 2015-11-20 MED ORDER — INSULIN ASPART 100 UNIT/ML ~~LOC~~ SOLN
10.0000 [IU] | Freq: Once | SUBCUTANEOUS | Status: AC
  Administered 2015-11-20: 10 [IU] via INTRAVENOUS
  Filled 2015-11-20: qty 1

## 2015-11-20 MED ORDER — SODIUM CHLORIDE 0.9 % IV BOLUS (SEPSIS)
1000.0000 mL | Freq: Once | INTRAVENOUS | Status: AC
  Administered 2015-11-20: 1000 mL via INTRAVENOUS

## 2015-11-20 MED ORDER — INSULIN GLARGINE 100 UNIT/ML ~~LOC~~ SOLN
70.0000 [IU] | Freq: Every day | SUBCUTANEOUS | Status: DC
  Administered 2015-11-20: 70 [IU] via SUBCUTANEOUS
  Filled 2015-11-20 (×2): qty 0.7

## 2015-11-20 MED ORDER — HYDROCODONE-ACETAMINOPHEN 5-325 MG PO TABS
2.0000 | ORAL_TABLET | ORAL | Status: DC | PRN
  Administered 2015-11-20: 2 via ORAL
  Filled 2015-11-20: qty 2

## 2015-11-20 MED ORDER — LORAZEPAM 2 MG/ML IJ SOLN
1.0000 mg | Freq: Four times a day (QID) | INTRAMUSCULAR | Status: DC | PRN
  Administered 2015-11-20 (×3): 1 mg via INTRAVENOUS
  Filled 2015-11-20 (×3): qty 1

## 2015-11-20 MED ORDER — METOCLOPRAMIDE HCL 5 MG/ML IJ SOLN
10.0000 mg | Freq: Once | INTRAMUSCULAR | Status: AC
  Administered 2015-11-20: 10 mg via INTRAVENOUS
  Filled 2015-11-20: qty 2

## 2015-11-20 MED ORDER — ENOXAPARIN SODIUM 40 MG/0.4ML ~~LOC~~ SOLN
40.0000 mg | SUBCUTANEOUS | Status: DC
  Administered 2015-11-21: 40 mg via SUBCUTANEOUS
  Filled 2015-11-20 (×2): qty 0.4

## 2015-11-20 MED ORDER — HYDROMORPHONE HCL 1 MG/ML IJ SOLN
1.0000 mg | Freq: Once | INTRAMUSCULAR | Status: AC
  Administered 2015-11-20: 1 mg via INTRAVENOUS
  Filled 2015-11-20: qty 1

## 2015-11-20 MED ORDER — ASPIRIN EC 325 MG PO TBEC: 325.0000 mg | DELAYED_RELEASE_TABLET | Freq: Every day | ORAL | Status: DC

## 2015-11-20 MED ORDER — ROSUVASTATIN CALCIUM 40 MG PO TABS
40.0000 mg | ORAL_TABLET | Freq: Every day | ORAL | Status: DC
  Administered 2015-11-20: 40 mg via ORAL
  Filled 2015-11-20: qty 4

## 2015-11-20 MED ORDER — INSULIN ASPART 100 UNIT/ML ~~LOC~~ SOLN
0.0000 [IU] | Freq: Three times a day (TID) | SUBCUTANEOUS | Status: DC
  Administered 2015-11-20: 7 [IU] via SUBCUTANEOUS
  Administered 2015-11-20 – 2015-11-21 (×2): 2 [IU] via SUBCUTANEOUS
  Administered 2015-11-21: 3 [IU] via SUBCUTANEOUS
  Administered 2015-11-21: 5 [IU] via SUBCUTANEOUS
  Filled 2015-11-20: qty 1

## 2015-11-20 MED ORDER — SODIUM CHLORIDE 0.9 % IV BOLUS (SEPSIS): 1000.0000 mL | Freq: Once | INTRAVENOUS | Status: DC

## 2015-11-20 MED ORDER — INSULIN ASPART 100 UNIT/ML FLEXPEN: 20.0000 [IU] | PEN_INJECTOR | Freq: Three times a day (TID) | SUBCUTANEOUS | Status: DC

## 2015-11-20 MED ORDER — SODIUM CHLORIDE 0.9 % IV SOLN
INTRAVENOUS | Status: AC
  Administered 2015-11-20: 03:00:00 via INTRAVENOUS

## 2015-11-20 MED ORDER — ZOLPIDEM TARTRATE 5 MG PO TABS
5.0000 mg | ORAL_TABLET | Freq: Once | ORAL | Status: AC
  Administered 2015-11-20: 5 mg via ORAL
  Filled 2015-11-20: qty 1

## 2015-11-20 MED ORDER — SODIUM CHLORIDE 0.9 % IV SOLN: INTRAVENOUS | Status: AC

## 2015-11-20 MED ORDER — ONDANSETRON HCL 4 MG/2ML IJ SOLN: 4.0000 mg | Freq: Four times a day (QID) | INTRAMUSCULAR | Status: DC | PRN

## 2015-11-20 MED ORDER — SODIUM CHLORIDE 0.9 % IV SOLN
INTRAVENOUS | Status: DC
  Administered 2015-11-20: 02:00:00 via INTRAVENOUS

## 2015-11-20 MED ORDER — NICOTINE 14 MG/24HR TD PT24
14.0000 mg | MEDICATED_PATCH | Freq: Every day | TRANSDERMAL | Status: DC
  Administered 2015-11-20 – 2015-11-21 (×2): 14 mg via TRANSDERMAL
  Filled 2015-11-20 (×2): qty 1

## 2015-11-20 MED ORDER — FENTANYL CITRATE (PF) 100 MCG/2ML IJ SOLN
50.0000 ug | Freq: Once | INTRAMUSCULAR | Status: AC
  Administered 2015-11-20: 50 ug via INTRAVENOUS
  Filled 2015-11-20: qty 2

## 2015-11-20 MED ORDER — ACETAMINOPHEN 325 MG PO TABS: 650.0000 mg | ORAL_TABLET | ORAL | Status: DC | PRN

## 2015-11-20 MED ORDER — SODIUM CHLORIDE 0.9 % IV SOLN: INTRAVENOUS | Status: DC

## 2015-11-20 MED ORDER — LIVING WELL WITH DIABETES BOOK
Freq: Once | Status: DC
  Filled 2015-11-20: qty 1

## 2015-11-20 MED ORDER — FENOFIBRATE 160 MG PO TABS
160.0000 mg | ORAL_TABLET | Freq: Every day | ORAL | Status: DC
  Administered 2015-11-20 – 2015-11-21 (×2): 160 mg via ORAL
  Filled 2015-11-20 (×2): qty 1

## 2015-11-20 MED ORDER — HYDROMORPHONE HCL 1 MG/ML IJ SOLN
0.5000 mg | Freq: Once | INTRAMUSCULAR | Status: AC
  Administered 2015-11-20: 0.5 mg via INTRAVENOUS
  Filled 2015-11-20: qty 1

## 2015-11-20 MED ORDER — KETOROLAC TROMETHAMINE 30 MG/ML IJ SOLN
30.0000 mg | Freq: Four times a day (QID) | INTRAMUSCULAR | Status: DC | PRN
  Administered 2015-11-20: 30 mg via INTRAVENOUS
  Filled 2015-11-20: qty 1

## 2015-11-20 MED ORDER — ASPIRIN EC 81 MG PO TBEC
81.0000 mg | DELAYED_RELEASE_TABLET | Freq: Every day | ORAL | Status: DC
  Administered 2015-11-20 – 2015-11-21 (×2): 81 mg via ORAL
  Filled 2015-11-20 (×2): qty 1

## 2015-11-20 NOTE — Progress Notes (Addendum)
Inpatient Diabetes Program Recommendations  AACE/ADA: New Consensus Statement on Inpatient Glycemic Control (2015)  Target Ranges:  Prepandial:   less than 140 mg/dL      Peak postprandial:   less than 180 mg/dL (1-2 hours)      Critically ill patients:  140 - 180 mg/dL   Review of Glycemic Control  Results for Devin Wells, Devin Wells (MRN [id number]) as of 11/20/2015 18:12  Ref. Range 11/06/2015 13:16  Hemoglobin A1C Latest Ref Range: <5.7 % 14.4 (H)  Results for Devin Wells, Devin Wells (MRN [id number]) as of 11/20/2015 18:12  Ref. Range 11/20/2015 01:28 11/20/2015 02:52 11/20/2015 07:07 11/20/2015 11:29 11/20/2015 16:12  Glucose-Capillary Latest Ref Range: 65-99 mg/dL 782389 (H) 956422 (H) 213247 (H) 312 (H) 185 (H)   Spoke with pt and son regarding his HgbA1C of 14.4%. Pt states he checks blood sugars but ran out of insulin yesterday. Was trying to stretch it since he didn't have refill prescription. Lives in [place], but wants to move to [place] to get medical marijuana for his neuropathy. Discussed diet, exercise and importance of improving his glucose control. States he wants to start making changes, improving diet and giving himself the correction amount of insulin. On Lantus 60-70 units QD and Novolog 10-12 units tidwc. To view diabetes videos and read Living Well With Diabetes book. Will follow up with pt in am for any questions/concerns.  Thank you. Ailene Ardshonda Aaran Enberg, RD, LDN, CDE Inpatient Diabetes Coordinator [phone]  Addendum: Increase diet to CHO mod med.

## 2015-11-20 NOTE — ED Provider Notes (Signed)
CSN: [id number]     Arrival date & time 11/19/15  2334 History   First MD Initiated Contact with Patient 11/19/15 2334     Chief Complaint  Patient presents with  . Chest Pain     (Consider location/radiation/quality/duration/timing/severity/associated sxs/prior Treatment) HPI  Patient to the ER with PMH of diabetes with neuropathy, hypertension,, MI, pancreatitis, anxiety comes to the ER with complaints of severe neuropathy. During screening questions he admits to chest pain but says he doesn't care about the chest pain at all and wants to only be treated for his chronic neuropathy and back pain. He moved here from [place] two months ago where he was involved with a PCP and pain management clinic and has been trying to get in to see a pain clinic in [place] but is two weeks out. He says the neuropathy is so severe that he doesn't care about much else.  The chest pain he feels is from anxiety over the pain he is experiencing due to not being able to tolerate the pain. He does have a history of MI and stent placement, 324 mg aspirin and 3 SL nitro which did not cause any resolution to his symptoms. He also is hyperglycemic at 540.  Past Medical History  Diagnosis Date  . Diabetes with neurologic complications (HCC)     neuropathy  . Hypertension   . Myocardial infarction acute (HCC) 2015  . Pancreatitis   . Anxiety    Past Surgical History  Procedure Laterality Date  . Cholecystectomy     No family history on file. Social History  Substance Use Topics  . Smoking status: Current Every Day Smoker    Types: Cigarettes  . Smokeless tobacco: None  . Alcohol Use: No    Review of Systems  Review of Systems All other systems negative except as documented in the HPI. All pertinent positives and negatives as reviewed in the HPI.   Allergies  Review of patient's allergies indicates no known allergies.  Home Medications   Prior to Admission medications   Medication Sig Start Date End  Date Taking? Authorizing Provider  amitriptyline (ELAVIL) 25 MG tablet Take 1 tablet (25 mg total) by mouth at bedtime. 11/06/15   Quentin Angst, MD  carvedilol (COREG) 3.125 MG tablet Take 1 tablet (3.125 mg total) by mouth 2 (two) times daily. 10/23/15   Jacalyn Lefevre, MD  fenofibrate (TRICOR) 145 MG tablet Take 1 tablet (145 mg total) by mouth every morning. 10/23/15   Jacalyn Lefevre, MD  gabapentin (NEURONTIN) 300 MG capsule Take 3 capsules (900 mg total) by mouth 3 (three) times daily. 10/23/15   Jacalyn Lefevre, MD  HYDROcodone-acetaminophen (NORCO/VICODIN) 5-325 MG tablet Take 2 tablets by mouth every 4 (four) hours as needed. 10/23/15   Jacalyn Lefevre, MD  LANTUS SOLOSTAR 100 UNIT/ML Solostar Pen Inject 70 Units into the skin daily at 10 pm. 10/23/15   Jacalyn Lefevre, MD  NOVOLOG FLEXPEN 100 UNIT/ML FlexPen Inject 20 Units into the skin 3 (three) times daily with meals. 11/06/15   Newt Lukes, MD  pantoprazole (PROTONIX) 40 MG tablet Take 1 tablet (40 mg total) by mouth at bedtime. 10/23/15   Jacalyn Lefevre, MD  rosuvastatin (CRESTOR) 40 MG tablet Take 1 tablet (40 mg total) by mouth at bedtime. 10/23/15   Jacalyn Lefevre, MD   BP 112/83 mmHg  Pulse 88  Temp(Src) 99.6 F (37.6 C) (Oral)  Resp 22  Ht 6\' 4"  (1.93 m)  Wt 88.451 kg  BMI  23.75 kg/m2  SpO2 95% Physical Exam  Constitutional: He appears well-developed and well-nourished. No distress.  HENT:  Head: Normocephalic and atraumatic.  Nose: Nose normal.  Mouth/Throat: Uvula is midline, oropharynx is clear and moist and mucous membranes are normal.  Eyes: Pupils are equal, round, and reactive to light.  Neck: Normal range of motion. Neck supple.  Cardiovascular: Normal rate and regular rhythm.   Pulmonary/Chest: Effort normal.  Abdominal: Soft.  No signs of abdominal distention  Musculoskeletal:  No LE swelling Bilateral legs show no abnormalities or skin changes.  Neurological: He is alert.  Acting at baseline  Skin:  Skin is warm and dry. No rash noted.  Nursing note and vitals reviewed.   ED Course  Procedures (including critical care time) Labs Review Labs Reviewed  BASIC METABOLIC PANEL - Abnormal; Notable for the following:    Sodium 130 (*)    Chloride 98 (*)    CO2 21 (*)    Glucose, Bld 524 (*)    Calcium 8.5 (*)    All other components within normal limits  CBC - Abnormal; Notable for the following:    RBC 3.83 (*)    Hemoglobin 11.3 (*)    HCT 34.0 (*)    Platelets 123 (*)    All other components within normal limits  CBG MONITORING, ED - Abnormal; Notable for the following:    Glucose-Capillary 537 (*)    All other components within normal limits  CBG MONITORING, ED - Abnormal; Notable for the following:    Glucose-Capillary 389 (*)    All other components within normal limits  CBG MONITORING, ED - Abnormal; Notable for the following:    Glucose-Capillary 422 (*)    All other components within normal limits  I-STAT TROPOININ, ED  I-STAT TROPOININ, ED    Imaging Review Dg Chest 2 View  11/20/2015  CLINICAL DATA:  Acute onset of generalized chest and back pain. Initial encounter. EXAM: CHEST  2 VIEW COMPARISON:  Chest radiograph from 10/23/2015 FINDINGS: The lungs are well-aerated. A calcified granuloma is noted at the left midlung zone. There is no evidence of focal opacification, pleural effusion or pneumothorax. The heart is normal in size; the mediastinal contour is within normal limits. No acute osseous abnormalities are seen. Postoperative change is noted about the upper abdomen. IMPRESSION: No acute cardiopulmonary process seen. Electronically Signed   By: Roanna RaiderJeffery  Chang M.D.   On: 11/20/2015 00:33   I have personally reviewed and evaluated these images and lab results as part of my medical decision-making.   EKG Interpretation   Date/Time:  Tuesday Nov 19 2015 23:46:27 EDT Ventricular Rate:  99 PR Interval:  155 QRS Duration: 98 QT Interval:  350 QTC Calculation:  449 R Axis:   40 Text Interpretation:  Sinus rhythm Borderline T abnormalities, lateral  leads No significant change since last tracing Confirmed by Erroll Lunani, Adeleke  Ayokunle [phone]([id number]) on 11/20/2015 3:05:43 AM      MDM   Final diagnoses:  Chest pain, unspecified chest pain type  Peripheral neuropathic pain (HCC)  Hyperglycemia    The patients chest pain has improved but his symptoms are concerning. His glucose was originally  537, he was given 10 units of Novolog and 2 liters saline it improved to 389 and then it elevated to 422. The patient denies that he ate or drank anything.  I spoke with the patient and he is concerned about his CP and glucose levels, they typically run around 250-300 and he  is not sure why he hasn't been able to control them.   Dr. Toniann Fail has agreed to admit, obs, Tele, MCadmits.  Filed Vitals:   11/20/15 0130 11/20/15 0230  BP: 115/78 112/83  Pulse: 83 88  Temp:    Resp: 22 [address], PA-C 11/20/15 9604  Tomasita Crumble, MD 11/20/15 [phone]

## 2015-11-20 NOTE — Progress Notes (Signed)
Patient ID: Devin Wells, male   DOB: 1961-02-21, 55 y.o.   MRN: [id number]  Pt admitted after midnight. For details pleaser refer to admission note done 11/19/2015.  55 y.o. 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 Patient recently moved from [place] 2 months prior to this admission. He reports ongoing chest pain started since the day prior to the admission. Chest pain was pressure-like, 10 out of 10 in intensity, nonradiating, started when he woke up and persisted through the night. Pain was present at rest and with exertion.Then he decided to come to emergency room for evaluation. Pain was relieved with nitroglycerin given in ED.  On admission, patient was hemodynamically stable. His troponin level was within normal limits. EKG showed no acute ischemic changes. No acute cardiopulmonary findings on chest x-ray.  Assessment and plan:  Chest pain - Rule out ACS. At the 2 sets of troponins are negative so far. Awaiting another troponin level - Appreciate cardiology following and their recommendations - Follow-up on 2-D echo results - Continue pain management efforts  [facility]in [facility] [phone]

## 2015-11-20 NOTE — Consult Note (Signed)
Reason for Consult:   Chest pain  Requesting Physician: Dr Elisabeth Pigeon Primary Cardiologist New  HPI:   55 y/o Caucasian male with a complicated medical history, admitted to the ED last night with chest pain and we are asked to see in consult. The pt dates his medical problems to 2014. He was living in [place] and went to [place] to visit his father who was ill. Once there he became sick and was transferred to [facility] [place] with "necrotizing pancreatitis",  reportedly secondary to hypertriglyceridemia, (pt says he does not drink alcohol). He was hospitalized for > 3 months then. That course was complicated by transient renal failure requiring dialysis. Two days after he was discharged he went to the ED with chest pain and was airlifted back to [place] with an MI. He says he received two stents- he knows no other details. After that hospitalization he went to stay with his son in [place]. Shortly after arriving in [place] (this is still 2014) he developed GI bleeding and was admitted to [facility] for 3 months (no records available in [facility]). After that hospitalization he eventually moved back to [place]. Two months ago he moved in with his son in [place]. The pt saw Dr Felicity Coyer  In April after he ran out of his medications. These were resumed and he did well until yesterday when he developed pain "all over" including low back, bilateral hand, lower extremities, and SSCP. He received SL NTG with some relief of his chest pain. His Troponin has been negative and his EKG shows NSST changes. The pt's BS was 537 on adm. He admitted he had cut back on his insulin because he was running out.   PMHx:  Past Medical History  Diagnosis Date  . Diabetes with neurologic complications (HCC)     neuropathy  . Hypertension   . Myocardial infarction acute (HCC) 2015  . Pancreatitis   . Anxiety   . Hyperlipidemia     Past Surgical History  Procedure Laterality Date  . Cholecystectomy    . Angioplasty      SOCHx:  reports that he has been smoking Cigarettes.  He does not have any smokeless tobacco history on file. He reports that he uses illicit drugs (Marijuana). He reports that he does not drink alcohol.  FAMHx: Family History  Problem Relation Age of Onset  . Alzheimer's disease Mother   . Diabetes Mellitus II Father   . Hypertension Father     ALLERGIES: No Known Allergies  ROS: Review of Systems: General: negative for chills, fever, night sweats or weight changes.  Cardiovascular: negative for, dyspnea on exertion, edema, orthopnea, palpitations, paroxysmal nocturnal dyspnea or shortness of breath HEENT: negative for any visual disturbances, blindness, glaucoma Dermatological: negative for rash Respiratory: negative for cough, hemoptysis, or wheezing Urologic: negative for hematuria or dysuria Abdominal: negative for nausea, vomiting, diarrhea, bright red blood per rectum, melena, or hematemesis Neurologic: negative for visual changes, syncope, or dizziness Musculoskeletal: negative for swelling Psych: cooperative and appropriate All other systems reviewed and are otherwise negative except as noted above.   HOME MEDICATIONS: Prior to Admission medications   Medication Sig Start Date End Date Taking? Authorizing Provider  amitriptyline (ELAVIL) 25 MG tablet Take 1 tablet (25 mg total) by mouth at bedtime. 11/06/15   Quentin Angst, MD  carvedilol (COREG) 3.125 MG tablet Take 1 tablet (3.125 mg total) by mouth 2 (two) times daily. 10/23/15   Jacalyn Lefevre, MD  fenofibrate (TRICOR) 145 MG tablet Take 1 tablet (145 mg total) by mouth every morning. 10/23/15   Jacalyn LefevreJulie Haviland, MD  gabapentin (NEURONTIN) 300 MG capsule Take 3 capsules (900 mg total) by mouth 3 (three) times daily. 10/23/15   Jacalyn LefevreJulie Haviland, MD  HYDROcodone-acetaminophen (NORCO/VICODIN) 5-325 MG tablet Take 2 tablets by mouth every 4 (four) hours as needed. 10/23/15   Jacalyn LefevreJulie Haviland, MD  LANTUS SOLOSTAR 100 UNIT/ML Solostar  Pen Inject 70 Units into the skin daily at 10 pm. 10/23/15   Jacalyn LefevreJulie Haviland, MD  NOVOLOG FLEXPEN 100 UNIT/ML FlexPen Inject 20 Units into the skin 3 (three) times daily with meals. 11/06/15   Newt LukesValerie A Leschber, MD  pantoprazole (PROTONIX) 40 MG tablet Take 1 tablet (40 mg total) by mouth at bedtime. 10/23/15   Jacalyn LefevreJulie Haviland, MD  rosuvastatin (CRESTOR) 40 MG tablet Take 1 tablet (40 mg total) by mouth at bedtime. 10/23/15   Jacalyn LefevreJulie Haviland, MD    HOSPITAL MEDICATIONS: I have reviewed the patient's current medications.  VITALS: Blood pressure 124/82, pulse 71, temperature 99.6 F (37.6 C), temperature source Oral, resp. rate 20, height 6\' 4"  (1.93 m), weight 195 lb (88.451 kg), SpO2 95 %.  PHYSICAL EXAM: General appearance: alert, cooperative and no distress Neck: no carotid bruit and no JVD Lungs: decreased BS c/w COPD Heart: regular rate and rhythm Abdomen: soft, non-tender; bowel sounds normal; no masses,  no organomegaly Extremities: no edema, plantars wart Lt foot Pulses: 2+ and symmetric Skin: Skin color, texture, turgor normal. No rashes or lesions Neurologic: Grossly normal  LABS: Results for orders placed or performed during the hospital encounter of 11/19/15 (from the past 24 hour(s))  CBG monitoring, ED     Status: Abnormal   Collection Time: 11/19/15 11:52 PM  Result Value Ref Range   Glucose-Capillary 537 (H) 65 - 99 mg/dL  Basic metabolic panel     Status: Abnormal   Collection Time: 11/19/15 11:54 PM  Result Value Ref Range   Sodium 130 (L) 135 - 145 mmol/L   Potassium 3.7 3.5 - 5.1 mmol/L   Chloride 98 (L) 101 - 111 mmol/L   CO2 21 (L) 22 - 32 mmol/L   Glucose, Bld 524 (H) 65 - 99 mg/dL   BUN 15 6 - 20 mg/dL   Creatinine, Ser 1.610.99 0.61 - 1.24 mg/dL   Calcium 8.5 (L) 8.9 - 10.3 mg/dL   GFR calc non Af Amer >60 >60 mL/min   GFR calc Af Amer >60 >60 mL/min   Anion gap 11 5 - 15  CBC     Status: Abnormal   Collection Time: 11/19/15 11:54 PM  Result Value Ref Range    WBC 5.9 4.0 - 10.5 K/uL   RBC 3.83 (L) 4.22 - 5.81 MIL/uL   Hemoglobin 11.3 (L) 13.0 - 17.0 g/dL   HCT 09.634.0 (L) 04.539.0 - 40.952.0 %   MCV 88.8 78.0 - 100.0 fL   MCH 29.5 26.0 - 34.0 pg   MCHC 33.2 30.0 - 36.0 g/dL   RDW 81.114.3 91.411.5 - 78.215.5 %   Platelets 123 (L) 150 - 400 K/uL  I-stat troponin, ED     Status: None   Collection Time: 11/19/15 11:59 PM  Result Value Ref Range   Troponin i, poc 0.00 0.00 - 0.08 ng/mL   Comment 3          POC CBG, ED     Status: Abnormal   Collection Time: 11/20/15  1:28 AM  Result Value Ref Range  Glucose-Capillary 389 (H) 65 - 99 mg/dL  I-stat troponin, ED     Status: None   Collection Time: 11/20/15  2:44 AM  Result Value Ref Range   Troponin i, poc 0.00 0.00 - 0.08 ng/mL   Comment 3          POC CBG, ED     Status: Abnormal   Collection Time: 11/20/15  2:52 AM  Result Value Ref Range   Glucose-Capillary 422 (H) 65 - 99 mg/dL   Comment 1 Notify RN    Comment 2 Call MD NNP PA CNM    Comment 3 Document in Chart   Urine rapid drug screen (hosp performed)     Status: Abnormal   Collection Time: 11/20/15  4:25 AM  Result Value Ref Range   Opiates NONE DETECTED NONE DETECTED   Cocaine NONE DETECTED NONE DETECTED   Benzodiazepines NONE DETECTED NONE DETECTED   Amphetamines NONE DETECTED NONE DETECTED   Tetrahydrocannabinol POSITIVE (A) NONE DETECTED   Barbiturates NONE DETECTED NONE DETECTED  Urinalysis, Routine w reflex microscopic (not at [facility])     Status: Abnormal   Collection Time: 11/20/15  4:25 AM  Result Value Ref Range   Color, Urine YELLOW YELLOW   APPearance CLEAR CLEAR   Specific Gravity, Urine 1.038 (H) 1.005 - 1.030   pH 6.0 5.0 - 8.0   Glucose, UA >1000 (A) NEGATIVE mg/dL   Hgb urine dipstick NEGATIVE NEGATIVE   Bilirubin Urine NEGATIVE NEGATIVE   Ketones, ur NEGATIVE NEGATIVE mg/dL   Protein, ur NEGATIVE NEGATIVE mg/dL   Nitrite NEGATIVE NEGATIVE   Leukocytes, UA NEGATIVE NEGATIVE  Urine microscopic-add on     Status: None    Collection Time: 11/20/15  4:25 AM  Result Value Ref Range   Squamous Epithelial / LPF NONE SEEN NONE SEEN   WBC, UA 0-5 0 - 5 WBC/hpf   RBC / HPF 0-5 0 - 5 RBC/hpf   Bacteria, UA NONE SEEN NONE SEEN  Type and screen [facility]     Status: None   Collection Time: 11/20/15  4:33 AM  Result Value Ref Range   ABO/RH(D) O POS    Antibody Screen NEG    Sample Expiration 11/23/2015   Beta-hydroxybutyric acid     Status: None   Collection Time: 11/20/15  4:35 AM  Result Value Ref Range   Beta-Hydroxybutyric Acid 0.11 0.05 - 0.27 mmol/L  Basic metabolic panel     Status: Abnormal   Collection Time: 11/20/15  4:35 AM  Result Value Ref Range   Sodium 135 135 - 145 mmol/L   Potassium 3.6 3.5 - 5.1 mmol/L   Chloride 103 101 - 111 mmol/L   CO2 23 22 - 32 mmol/L   Glucose, Bld 201 (H) 65 - 99 mg/dL   BUN 14 6 - 20 mg/dL   Creatinine, Ser 1.61 0.61 - 1.24 mg/dL   Calcium 8.8 (L) 8.9 - 10.3 mg/dL   GFR calc non Af Amer >60 >60 mL/min   GFR calc Af Amer >60 >60 mL/min   Anion gap 9 5 - 15  CBC with Differential/Platelet     Status: Abnormal   Collection Time: 11/20/15  4:35 AM  Result Value Ref Range   WBC 4.5 4.0 - 10.5 K/uL   RBC 3.97 (L) 4.22 - 5.81 MIL/uL   Hemoglobin 11.7 (L) 13.0 - 17.0 g/dL   HCT 09.6 (L) 04.5 - 40.9 %   MCV 88.9 78.0 - 100.0 fL  MCH 29.5 26.0 - 34.0 pg   MCHC 33.1 30.0 - 36.0 g/dL   RDW 16.1 09.6 - 04.5 %   Platelets 126 (L) 150 - 400 K/uL   Neutrophils Relative % 70 %   Neutro Abs 3.1 1.7 - 7.7 K/uL   Lymphocytes Relative 21 %   Lymphs Abs 0.9 0.7 - 4.0 K/uL   Monocytes Relative 8 %   Monocytes Absolute 0.4 0.1 - 1.0 K/uL   Eosinophils Relative 1 %   Eosinophils Absolute 0.0 0.0 - 0.7 K/uL   Basophils Relative 0 %   Basophils Absolute 0.0 0.0 - 0.1 K/uL  Hepatic function panel     Status: Abnormal   Collection Time: 11/20/15  4:35 AM  Result Value Ref Range   Total Protein 5.4 (L) 6.5 - 8.1 g/dL   Albumin 3.1 (L) 3.5 - 5.0 g/dL    AST 23 15 - 41 U/L   ALT 19 17 - 63 U/L   Alkaline Phosphatase 67 38 - 126 U/L   Total Bilirubin 0.5 0.3 - 1.2 mg/dL   Bilirubin, Direct <4.0 (L) 0.1 - 0.5 mg/dL   Indirect Bilirubin NOT CALCULATED 0.3 - 0.9 mg/dL  Lipase, blood     Status: None   Collection Time: 11/20/15  4:35 AM  Result Value Ref Range   Lipase 11 11 - 51 U/L  Troponin I (q 6hr x 3)     Status: None   Collection Time: 11/20/15  4:35 AM  Result Value Ref Range   Troponin I <0.03 <0.031 ng/mL  CBG monitoring, ED     Status: Abnormal   Collection Time: 11/20/15  7:07 AM  Result Value Ref Range   Glucose-Capillary 247 (H) 65 - 99 mg/dL    EKG: NSR NSST changes  IMAGING: Dg Chest 2 View  11/20/2015  CLINICAL DATA:  Acute onset of generalized chest and back pain. Initial encounter. EXAM: CHEST  2 VIEW COMPARISON:  Chest radiograph from 10/23/2015 FINDINGS: The lungs are well-aerated. A calcified granuloma is noted at the left midlung zone. There is no evidence of focal opacification, pleural effusion or pneumothorax. The heart is normal in size; the mediastinal contour is within normal limits. No acute osseous abnormalities are seen. Postoperative change is noted about the upper abdomen. IMPRESSION: No acute cardiopulmonary process seen. Electronically Signed   By: Roanna Raider M.D.   On: 11/20/2015 00:33    IMPRESSION: Principal Problem:   Chest pain with moderate risk of acute coronary syndrome Active Problems:   CAD- S/P 2014 (in [place])   Diabetes type 2, uncontrolled (HCC)   Abdominal pain, chronic, right lower quadrant   Essential hypertension   Peripheral neuropathic pain (HCC)   Generalized anxiety disorder   HLD (hyperlipidemia)   Tobacco abuse   Hypothyroidism   Necrotizing pancreatitis in 2014 (hosp x 3 mos)   H/O: GI bleed-2014   RECOMMENDATION: Will attempt to get records from [place]. Add PO nitrate. He was not on ASA prior to adm (? Secondary to GI bleed history), would start 81 mg as opposed to 325  mg. He will probably need a Myoview once his BS are controlled.   Time Spent Directly with Patient: 50 minutes  Corine Shelter, [place]  [phone] beeper 11/20/2015, 8:10 AM  The patient has been seen in conjunction with Corine Shelter, PA-C. All aspects of care have been considered and discussed. The patient has been personally interviewed, examined, and all clinical data has been reviewed.   States he  came to the emergency room because of peripheral neuropathy.Upset that we are concentrating on his complaint of chest pain. Prior history of coronary stenting in 2014.No recurrent difficulty or chest pain until recently.  Overall, we have to consider acute coronary syndrome given his history. To this point, there is no evidence of myocardial infarction. We will try to obtain data from prior hospitals concerning his coronary angiography and stent procedure.  Once blood sugar and other metabolic parameters are better controlled, he will need to have repeat cath or myocardial perfusion imaging depending upon data discovered in old records and current database.

## 2015-11-20 NOTE — ED Notes (Signed)
Attempted report 

## 2015-11-20 NOTE — ED Notes (Signed)
Pt. Transported to ECHO at this time.

## 2015-11-20 NOTE — ED Notes (Signed)
All meds late due to meds not being verified by pharmacy. Pharmacy contacted.

## 2015-11-20 NOTE — ED Notes (Signed)
PA Marlon Peliffany Greene made aware of elevated CBG as well as pt c/o pain in the lower legs and feet

## 2015-11-20 NOTE — H&P (Signed)
History and Physical    Devin Feinsteinddie Mcinturff ZOX:[id number] DOB: Mar 16, 1961 DOA: 11/19/2015  PCP: Rene PaciValerie Leschber, MD  Patient coming from: Home.  Chief Complaint: Chest pain.  HPI: Devin Wells is a 55 y.o. 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 Patient started experience chest pain since yesterday morning after he woke up. Pain is retrosternal pressure-like increased on exertion persistent with mild shortness of breath. Later in the evening around 11 PM patient decided to come to the ER and in the ER patient was given sublingual nitroglycerin following which patient chest pain resolved. Patient is chest pain-free at this time. EKG was showing nonspecific findings. Chest x-ray was unremarkable. In addition patient pressure was found to be markedly elevated patient is usually on Lantus 70 units at bedtime but he had only 40 units at bedtime because he was running out of his medications. Patient was given additional NovoLog insulin in the ER with fluid bolus and repeat metabolic panel shows improvement.  ED Course: IV fluids nitroglycerin aspirin NovoLog insulin.  Review of Systems: As per HPI otherwise 10 point review of systems negative.    Past Medical History  Diagnosis Date  . Diabetes with neurologic complications (HCC)     neuropathy  . Hypertension   . Myocardial infarction acute (HCC) 2015  . Pancreatitis   . Anxiety   . Hyperlipidemia     Past Surgical History  Procedure Laterality Date  . Cholecystectomy    . Angioplasty       reports that he has been smoking Cigarettes.  He does not have any smokeless tobacco history on file. He reports that he uses illicit drugs (Marijuana). He reports that he does not drink alcohol.  No Known  Allergies  Family History  Problem Relation Age of Onset  . Alzheimer's disease Mother   . Diabetes Mellitus II Father   . Hypertension Father     Prior to Admission medications   Medication Sig Start Date End Date Taking? Authorizing Provider  amitriptyline (ELAVIL) 25 MG tablet Take 1 tablet (25 mg total) by mouth at bedtime. 11/06/15   Quentin Angstlugbemiga E Jegede, MD  carvedilol (COREG) 3.125 MG tablet Take 1 tablet (3.125 mg total) by mouth 2 (two) times daily. 10/23/15   Jacalyn LefevreJulie Haviland, MD  fenofibrate (TRICOR) 145 MG tablet Take 1 tablet (145 mg total) by mouth every morning. 10/23/15   Jacalyn LefevreJulie Haviland, MD  gabapentin (NEURONTIN) 300 MG capsule Take 3 capsules (900 mg total) by mouth 3 (three) times daily. 10/23/15   Jacalyn LefevreJulie Haviland, MD  HYDROcodone-acetaminophen (NORCO/VICODIN) 5-325 MG tablet Take 2 tablets by mouth every 4 (four) hours as needed. 10/23/15   Jacalyn LefevreJulie Haviland, MD  LANTUS SOLOSTAR 100 UNIT/ML Solostar Pen Inject 70 Units into the skin daily at 10 pm. 10/23/15   Jacalyn LefevreJulie Haviland, MD  NOVOLOG FLEXPEN 100 UNIT/ML FlexPen Inject 20 Units into the skin 3 (three) times daily with meals. 11/06/15   Newt LukesValerie A Leschber, MD  pantoprazole (PROTONIX) 40 MG tablet Take 1 tablet (40 mg total) by mouth at bedtime. 10/23/15   Jacalyn LefevreJulie Haviland, MD  rosuvastatin (CRESTOR) 40 MG tablet Take 1 tablet (40 mg total) by mouth at bedtime. 10/23/15   Jacalyn LefevreJulie Haviland, MD    Physical Exam: Filed Vitals:   11/20/15 0430 11/20/15 0500 11/20/15 0530 11/20/15 0600  BP: 105/87 104/69 115/73 113/68  Pulse: 90 74 65 69  Temp:      TempSrc:      Resp: 20 23 15 16   Height:      Weight:      SpO2: 97% 95% 95% 92%      Constitutional: Not in distress. Filed Vitals:   11/20/15 0430 11/20/15 0500 11/20/15 0530 11/20/15 0600  BP: 105/87 104/69 115/73 113/68  Pulse: 90 74 65 69  Temp:      TempSrc:      Resp: 20 23 15 16   Height:      Weight:      SpO2: 97% 95% 95% 92%   Eyes: Anicteric no pallor. ENMT: No  discharge from the ears eyes nose and mouth. Neck: No mass felt. No JVD appreciated. Respiratory: No rhonchi or crepitations. Cardiovascular: S1-S2 heard. Abdomen: Soft nontender bowel sounds present. Musculoskeletal: No edema. Skin: No rash. Neurologic: Alert oriented to time place and person. Moves all extremities. Psychiatric: Appears normal.   Labs on Admission: I have personally reviewed following labs and imaging studies  CBC:  Recent Labs Lab 11/19/15 2354 11/20/15 0435  WBC 5.9 4.5  NEUTROABS  --  3.1  HGB 11.3* 11.7*  HCT 34.0* 35.3*  MCV 88.8 88.9  PLT 123* 126*   Basic Metabolic Panel:  Recent Labs Lab 11/19/15 2354 11/20/15 0435  NA 130* 135  K 3.7 3.6  CL 98* 103  CO2 21* 23  GLUCOSE 524* 201*  BUN 15 14  CREATININE 0.99 0.90  CALCIUM 8.5* 8.8*   GFR: Estimated Creatinine Clearance: 113.9 mL/min (by C-G formula based on Cr of 0.9). Liver Function Tests:  Recent Labs Lab 11/20/15 0435  AST 23  ALT 19  ALKPHOS 67  BILITOT 0.5  PROT 5.4*  ALBUMIN 3.1*    Recent Labs Lab 11/20/15 0435  LIPASE 11   No results for input(s): AMMONIA in the last 168 hours. Coagulation Profile: No results for input(s): INR, PROTIME in the last 168 hours. Cardiac Enzymes: No results for input(s): CKTOTAL, CKMB, CKMBINDEX, TROPONINI in the last 168 hours. BNP (last 3 results) No results for input(s): PROBNP in the last 8760 hours. HbA1C: No results for input(s): HGBA1C in the last 72 hours. CBG:  Recent Labs Lab 11/19/15 2352 11/20/15 0128 11/20/15 0252  GLUCAP 537* 389* 422*   Lipid Profile: No results for input(s): CHOL, HDL, LDLCALC, TRIG, CHOLHDL, LDLDIRECT in the last 72 hours. Thyroid Function Tests: No results for input(s): TSH, T4TOTAL, FREET4, T3FREE, THYROIDAB in the last 72 hours. Anemia Panel: No results for input(s): VITAMINB12, FOLATE, FERRITIN, TIBC, IRON, RETICCTPCT in the last 72 hours. Urine analysis:    Component Value  Date/Time   COLORURINE YELLOW 11/20/2015 0425   APPEARANCEUR CLEAR 11/20/2015 0425   LABSPEC 1.038* 11/20/2015 0425   PHURINE 6.0 11/20/2015 0425   GLUCOSEU >1000* 11/20/2015 0425   HGBUR NEGATIVE 11/20/2015 0425   BILIRUBINUR NEGATIVE 11/20/2015 0425   KETONESUR NEGATIVE 11/20/2015 0425   PROTEINUR NEGATIVE 11/20/2015 0425   NITRITE NEGATIVE 11/20/2015 0425   LEUKOCYTESUR NEGATIVE 11/20/2015 0425   Sepsis Labs: @LABRCNTIP (procalcitonin:4,lacticidven:4) )No results found for this or any previous visit (from the past 240 hour(s)).   Radiological Exams on Admission: Dg Chest 2 View  11/20/2015  CLINICAL DATA:  Acute onset of generalized chest and back pain. Initial encounter. EXAM: CHEST  2 VIEW COMPARISON:  Chest radiograph from 10/23/2015 FINDINGS: The lungs are well-aerated. A calcified granuloma is noted at the  left midlung zone. There is no evidence of focal opacification, pleural effusion or pneumothorax. The heart is normal in size; the mediastinal contour is within normal limits. No acute osseous abnormalities are seen. Postoperative change is noted about the upper abdomen. IMPRESSION: No acute cardiopulmonary process seen. Electronically Signed   By: Roanna Raider M.D.   On: 11/20/2015 00:33    EKG: Independently reviewed. Normal sinus rhythm with nonspecific T-wave changes.  Assessment/Plan Principal Problem:   Chest pain Active Problems:   Type 2 diabetes mellitus with hyperglycemia (HCC)   CAD in native artery   HLD (hyperlipidemia)   Tobacco abuse    #1. Chest pain with history of CAD status post stenting - concerning for angina. Personally chest pain-free after nitroglycerin sublingual. Cycle cardiac markers check 2-D echo continue aspirin Coreg statins. Cardiology consult. #2. Diabetes mellitus type 2 with hyperglycemia - patient did not take his required amount of insulin since he was running out of it. Insulin improved with NovoLog given in the ER. At this time  we'll continue his home dose of Lantus 70 units at bedtime with NovoLog pre-meals and sliding scale coverage. Premeal insulin may need to be held if patient is going for procedure. #3. Hyperlipidemia on statins and TriCor. #4. History of necrotizing pancreatitis secondary to hypertriglyceridemia - continue TriCor and statins. #5. Tobacco abuse and marijuana abuse - cessation counseling requested. #6. Anemia with thrombocytopenia - closely follow CBC going to further worsening and patient probably will need further workup as outpatient if there is no further worsening.   DVT prophylaxis: Lovenox. Code Status: Full code.  Family Communication: No family at the bedside.  Disposition Plan: Home.  Consults called: Cardiology consult requested through email.  Admission status: Observation. Telemetry.    Eduard Clos MD [facility] Pager [phone].  If 7PM-7AM, please contact night-coverage [email or web address] Password [email or web address]  11/20/2015, 6:09 AM

## 2015-11-20 NOTE — Progress Notes (Signed)
  Echocardiogram 2D Echocardiogram has been performed.  Delcie RochENNINGTON, Celise Bazar 11/20/2015, 10:24 AM

## 2015-11-20 NOTE — ED Notes (Signed)
Clear liquid diet ordered.

## 2015-11-21 ENCOUNTER — Encounter (HOSPITAL_COMMUNITY): Payer: Medicare Other

## 2015-11-21 ENCOUNTER — Encounter (HOSPITAL_BASED_OUTPATIENT_CLINIC_OR_DEPARTMENT_OTHER): Payer: Medicare Other

## 2015-11-21 DIAGNOSIS — E785 Hyperlipidemia, unspecified: Secondary | ICD-10-CM

## 2015-11-21 DIAGNOSIS — E1165 Type 2 diabetes mellitus with hyperglycemia: Secondary | ICD-10-CM | POA: Diagnosis not present

## 2015-11-21 DIAGNOSIS — I2510 Atherosclerotic heart disease of native coronary artery without angina pectoris: Secondary | ICD-10-CM

## 2015-11-21 DIAGNOSIS — R079 Chest pain, unspecified: Secondary | ICD-10-CM | POA: Diagnosis not present

## 2015-11-21 DIAGNOSIS — Z9861 Coronary angioplasty status: Secondary | ICD-10-CM | POA: Diagnosis not present

## 2015-11-21 DIAGNOSIS — I10 Essential (primary) hypertension: Secondary | ICD-10-CM | POA: Diagnosis not present

## 2015-11-21 LAB — NM MYOCAR MULTI W/SPECT W/WALL MOTION / EF
CHL CUP NUCLEAR SRS: 8
CHL CUP NUCLEAR SSS: 17
Exercise duration (min): 4 min
Exercise duration (sec): 0 s
LHR: 0.48
LVDIAVOL: 180 mL (ref 62–150)
LVSYSVOL: 92 mL
NUC STRESS TID: 1.2
Rest HR: 76 {beats}/min
SDS: 9

## 2015-11-21 LAB — GLUCOSE, CAPILLARY
GLUCOSE-CAPILLARY: 167 mg/dL — AB (ref 65–99)
Glucose-Capillary: 227 mg/dL — ABNORMAL HIGH (ref 65–99)
Glucose-Capillary: 282 mg/dL — ABNORMAL HIGH (ref 65–99)

## 2015-11-21 MED ORDER — REGADENOSON 0.4 MG/5ML IV SOLN
0.4000 mg | Freq: Once | INTRAVENOUS | Status: AC
  Administered 2015-11-21: 0.4 mg via INTRAVENOUS

## 2015-11-21 MED ORDER — TECHNETIUM TC 99M TETROFOSMIN IV KIT
30.0000 | PACK | Freq: Once | INTRAVENOUS | Status: AC | PRN
  Administered 2015-11-21: 30 via INTRAVENOUS

## 2015-11-21 MED ORDER — OXYCODONE HCL 15 MG PO TABS: 15.0000 mg | ORAL_TABLET | ORAL | Status: DC | PRN

## 2015-11-21 MED ORDER — TECHNETIUM TC 99M TETROFOSMIN IV KIT
10.0000 | PACK | Freq: Once | INTRAVENOUS | Status: AC | PRN
  Administered 2015-11-21: 10 via INTRAVENOUS

## 2015-11-21 MED ORDER — OXYCODONE HCL 15 MG PO TABS: 15.0000 mg | ORAL_TABLET | ORAL | Status: AC | PRN

## 2015-11-21 MED ORDER — REGADENOSON 0.4 MG/5ML IV SOLN
INTRAVENOUS | Status: AC
  Administered 2015-11-21: 0.4 mg
  Filled 2015-11-21: qty 5

## 2015-11-21 NOTE — Progress Notes (Signed)
Patient wanting more pain meds. Asking for Dilaudid I.V. K. Schoor N.P. Aware see orders

## 2015-11-21 NOTE — Progress Notes (Addendum)
   I have reviewed the patient's nuclear images. The left anterior descending territory appears to have normal perfusion. There is inferolateral reduction in uptake that is nearly fixed with some peripheral ischemia. I would recommend managing the patient with up titration of medical therapy.Medical therapy should include up titration of beta blocker therapy and the addition of long-acting nitrates. Agree with antiplatelet therapy and high intensity statin therapy.  If symptoms or refractory to medical therapy, heart catheterization with PCI should be considered.   The greatest issue with Mr. Devin Wells is poor health care follow-up and medication noncompliance. Diabetes mellitus needs to be tightly managed to prevent progression of disease.

## 2015-11-21 NOTE — Discharge Instructions (Signed)

## 2015-11-21 NOTE — Care Management Obs Status (Signed)
MEDICARE OBSERVATION STATUS NOTIFICATION   Patient Details  Name: Devin Wells MRN: [id number] Date of Birth: [birth year]   Medicare Observation Status Notification Given:  Yes    Darrold SpanWebster, Isreal Moline Hall, RN 11/21/2015, 11:31 AM

## 2015-11-21 NOTE — Progress Notes (Signed)
Subjective:  No further chest pain  Objective:  Vital Signs in the last 24 hours: Temp:  [97.7 F (36.5 C)-97.9 F (36.6 C)] 97.7 F (36.5 C) (05/25 0448) Pulse Rate:  [64-89] 73 (05/25 0448) Resp:  [14-19] 18 (05/25 0448) BP: (112-149)/(65-97) 122/77 mmHg (05/25 0448) SpO2:  [93 %-99 %] 94 % (05/25 0448)  Intake/Output from previous day:  Intake/Output Summary (Last 24 hours) at 11/21/15 0823 Last data filed at 11/21/15 0430  Gross per 24 hour  Intake    480 ml  Output    820 ml  Net   -340 ml    Physical Exam: General appearance: alert, cooperative and no distress Neck: no carotid bruit and no JVD Lungs: clear to auscultation bilaterally Heart: regular rate and rhythm Neurologic: Grossly normal   Rate: 72  Rhythm: normal sinus rhythm  Lab Results:  Recent Labs  11/19/15 2354 11/20/15 0435  WBC 5.9 4.5  HGB 11.3* 11.7*  PLT 123* 126*    Recent Labs  11/19/15 2354 11/20/15 0435  NA 130* 135  K 3.7 3.6  CL 98* 103  CO2 21* 23  GLUCOSE 524* 201*  BUN 15 14  CREATININE 0.99 0.90    Recent Labs  11/20/15 1224 11/20/15 1811  TROPONINI <0.03 <0.03   No results for input(s): INR in the last 72 hours.  Scheduled Meds: . amitriptyline  25 mg Oral QHS  . aspirin EC  81 mg Oral Daily  . carvedilol  3.125 mg Oral BID  . enoxaparin (LOVENOX) injection  40 mg Subcutaneous Q24H  . fenofibrate  160 mg Oral Daily  . gabapentin  900 mg Oral TID  . insulin aspart  0-9 Units Subcutaneous TID WC  . insulin aspart  20 Units Subcutaneous TID WC  . insulin glargine  70 Units Subcutaneous Q2200  . living well with diabetes book   Does not apply Once  . nicotine  14 mg Transdermal Daily  . pantoprazole  40 mg Oral QHS  . rosuvastatin  40 mg Oral QHS   Continuous Infusions:  PRN Meds:.acetaminophen, ketorolac, LORazepam, ondansetron (ZOFRAN) IV, oxyCODONE   Imaging: Dg Chest 2 View  11/20/2015  CLINICAL DATA:  Acute onset of generalized chest and back  pain. Initial encounter. EXAM: CHEST  2 VIEW COMPARISON:  Chest radiograph from 10/23/2015 FINDINGS: The lungs are well-aerated. A calcified granuloma is noted at the left midlung zone. There is no evidence of focal opacification, pleural effusion or pneumothorax. The heart is normal in size; the mediastinal contour is within normal limits. No acute osseous abnormalities are seen. Postoperative change is noted about the upper abdomen. IMPRESSION: No acute cardiopulmonary process seen. Electronically Signed   By: Roanna RaiderJeffery  Chang M.D.   On: 11/20/2015 00:33     Assessment/Plan:   Principal Problem:   Chest pain with moderate risk of acute coronary syndrome Active Problems:   CAD- S/P PCI 2014 (in [place])   Diabetes type 2, uncontrolled (HCC)   Essential hypertension   Peripheral neuropathic pain (HCC)   Generalized anxiety disorder   HLD (hyperlipidemia)   Tobacco abuse   Hypothyroidism   Necrotizing pancreatitis in 2014 (hosp x 3 mos)   H/O: GI bleed-2014   PLAN: [facility]. No further chest pain. Pt is NPO till seen by Dr Katrinka BlazingSmith this am.  Corine ShelterLuke Kilroy PA-C 11/21/2015, 8:23 AM [phone]  The patient has been seen in conjunction with Corine ShelterLuke Kilroy, MD. All aspects of care have been  considered and discussed. The patient has been personally interviewed, examined, and all clinical data has been reviewed.   MI has been ruled out.  Myoview today.

## 2015-11-21 NOTE — Discharge Summary (Signed)
Physician Discharge Summary  Devin Wells KGM:[id number] DOB: 1960-09-04 DOA: 11/19/2015  PCP: Rene Paci, MD  Admit date: 11/19/2015 Discharge date: 11/21/2015  Recommendations for Outpatient Follow-up:  1. Patient instructed to follow-up with primary care physician in one to 2 weeks after discharge. 2. Patient encouraged to be compliant with medications.  Discharge Diagnoses:  Principal Problem:   Chest pain with moderate risk of acute coronary syndrome Active Problems:   Diabetes type 2, uncontrolled (HCC)   Peripheral neuropathic pain (HCC)   Generalized anxiety disorder   CAD- S/P PCI 2014 (in [place])   HLD (hyperlipidemia)   Tobacco abuse   Essential hypertension   Hypothyroidism   Necrotizing pancreatitis in 2014 (hosp x 3 mos)   H/O: GI bleed-2014    Discharge Condition: stable   Diet recommendation: as tolerated   History of present illness:  55 y.o. 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 Patient recently moved from [place] 2 months prior to this admission. He reports ongoing chest pain started since the day prior to the admission. Chest pain was pressure-like, 10 out of 10 in intensity, nonradiating, started when he woke up and persisted through the night. Pain was present at rest and with exertion.Then he decided to come to emergency room for evaluation. Pain was relieved with nitroglycerin given in ED.  On admission, patient was hemodynamically stable. His troponin level was within normal limits. EKG showed no acute ischemic changes. No acute cardiopulmonary findings on chest x-ray.  Assessment and plan:   Hospital Course:   Principal Problem:   Chest pain with moderate risk of acute coronary syndrome - Troponin levels negative so far - 2-D echo showed ejection fraction 45% with akinesis of the  inferoseptal myocardium, grade 1 diastolic dysfunction - Myoview showed intermediate risk study  - Stable for discharge from cardiac standpoint    Active Problems:   Diabetes type 2, uncontrolled with long term insulin use (HCC) - Continue home insulin regime - Encouraged compliance    Peripheral neuropathic pain (HCC) - Continue gabapentin     HLD (hyperlipidemia) associated with type 2 DM - Continue Crestor and tricor     Tobacco abuse - Counseled on cessation    Essential hypertension - Continue carvedilol    Signed:  [name], MD  [facility] 11/21/2015, 8:36 AM  Pager #: [phone]  Time spent in minutes: more than 30 minutes  Procedures:  Myoview 11/21/15 - intermediate risk study   Consultations:  Cardiology   Discharge Exam: Filed Vitals:   11/20/15 2200 11/21/15 0448  BP: 118/69 122/77  Pulse: 89 73  Temp: 97.9 F (36.6 C) 97.7 F (36.5 C)  Resp: 18 18   Filed Vitals:   11/20/15 1400 11/20/15 1532 11/20/15 2200 11/21/15 0448  BP: 117/86 149/89 118/69 122/77  Pulse: 77 74 89 73  Temp:   97.9 F (36.6 C) 97.7 F (36.5 C)  TempSrc:   Oral Oral  Resp: Height:      Weight:      SpO2: 94% 99% 98% 94%    General: Pt is alert, follows commands appropriately, not in acute distress Cardiovascular: Regular rate and rhythm, S1/S2 +, no murmurs Respiratory: Clear to auscultation bilaterally, no wheezing, no crackles, no rhonchi Abdominal: Soft, non tender, non distended, bowel sounds +, no guarding Extremities: no edema, no cyanosis, pulses palpable bilaterally DP and PT Neuro:  Grossly nonfocal  Discharge Instructions  Discharge Instructions    Call MD for:  difficulty breathing, headache or visual disturbances    Complete by:  As directed      Call MD for:  persistant dizziness or light-headedness    Complete by:  As directed      Call MD for:  persistant nausea and vomiting    Complete by:  As directed      Call MD  for:  severe uncontrolled pain    Complete by:  As directed      Diet - low sodium heart healthy    Complete by:  As directed      Increase activity slowly    Complete by:  As directed             Medication List    STOP taking these medications        HYDROcodone-acetaminophen 5-325 MG tablet  Commonly known as:  NORCO/VICODIN      TAKE these medications        amitriptyline 25 MG tablet  Commonly known as:  ELAVIL  Take 1 tablet (25 mg total) by mouth at bedtime.     carvedilol 3.125 MG tablet  Commonly known as:  COREG  Take 1 tablet (3.125 mg total) by mouth 2 (two) times daily.     fenofibrate 145 MG tablet  Commonly known as:  TRICOR  Take 1 tablet (145 mg total) by mouth every morning.     gabapentin 300 MG capsule  Commonly known as:  NEURONTIN  Take 3 capsules (900 mg total) by mouth 3 (three) times daily.     LANTUS SOLOSTAR 100 UNIT/ML Solostar Pen  Generic drug:  Insulin Glargine  Inject 70 Units into the skin daily at 10 pm.     NOVOLOG FLEXPEN 100 UNIT/ML FlexPen  Generic drug:  insulin aspart  Inject 20 Units into the skin 3 (three) times daily with meals.     oxyCODONE 15 MG immediate release tablet  Commonly known as:  ROXICODONE  Take 1 tablet (15 mg total) by mouth every 4 (four) hours as needed.     pantoprazole 40 MG tablet  Commonly known as:  PROTONIX  Take 1 tablet (40 mg total) by mouth at bedtime.     rosuvastatin 40 MG tablet  Commonly known as:  CRESTOR  Take 1 tablet (40 mg total) by mouth at bedtime.           Follow-up Information    Follow up with Rene Paci, MD. Schedule an appointment as soon as possible for a visit in 2 weeks.   Specialty:  Internal Medicine   Why:  Follow up appt after recent hospitalization   Contact information:   [address] [place] [address] [phone]        The results of significant diagnostics from this hospitalization (including imaging, microbiology, ancillary and  laboratory) are listed below for reference.    Significant Diagnostic Studies: Dg Chest 2 View  11/20/2015  CLINICAL DATA:  Acute onset of generalized chest and back pain. Initial encounter. EXAM: CHEST  2 VIEW COMPARISON:  Chest radiograph from 10/23/2015 FINDINGS: The lungs are well-aerated. A calcified granuloma is noted at the left midlung zone. There is no evidence of focal opacification, pleural effusion or pneumothorax. The heart is normal in size; the mediastinal contour is within normal limits. No acute osseous abnormalities are seen. Postoperative change is noted about the upper abdomen. IMPRESSION: No acute  cardiopulmonary process seen. Electronically Signed   By: Roanna RaiderJeffery  Chang M.D.   On: 11/20/2015 00:33   Dg Abd Acute W/chest  10/23/2015  CLINICAL DATA:  Increasing right lower quadrant pain. EXAM: DG ABDOMEN ACUTE W/ 1V CHEST COMPARISON:  None. FINDINGS: Surgical clips and coils near the midline of the upper abdomen. Moderate stool burden throughout the colon. Nonobstructive bowel gas pattern. No free air organomegaly. No suspicious calcification. Heart and mediastinal contours are within normal limits. No focal opacities or effusions. No acute bony abnormality. IMPRESSION: Moderate stool burden.  No obstruction or free air. No active cardiopulmonary disease. Electronically Signed   By: Charlett NoseKevin  Dover M.D.   On: 10/23/2015 15:58    Microbiology: No results found for this or any previous visit (from the past 240 hour(s)).   Labs: Basic Metabolic Panel:  Recent Labs Lab 11/19/15 2354 11/20/15 0435  NA 130* 135  K 3.7 3.6  CL 98* 103  CO2 21* 23  GLUCOSE 524* 201*  BUN 15 14  CREATININE 0.99 0.90  CALCIUM 8.5* 8.8*   Liver Function Tests:  Recent Labs Lab 11/20/15 0435  AST 23  ALT 19  ALKPHOS 67  BILITOT 0.5  PROT 5.4*  ALBUMIN 3.1*    Recent Labs Lab 11/20/15 0435  LIPASE 11   No results for input(s): AMMONIA in the last 168 hours. CBC:  Recent Labs Lab  11/19/15 2354 11/20/15 0435  WBC 5.9 4.5  NEUTROABS  --  3.1  HGB 11.3* 11.7*  HCT 34.0* 35.3*  MCV 88.8 88.9  PLT 123* 126*   Cardiac Enzymes:  Recent Labs Lab 11/20/15 0435 11/20/15 1224 11/20/15 1811  TROPONINI <0.03 <0.03 <0.03   BNP: BNP (last 3 results) No results for input(s): BNP in the last 8760 hours.  ProBNP (last 3 results) No results for input(s): PROBNP in the last 8760 hours.  CBG:  Recent Labs Lab 11/20/15 0707 11/20/15 1129 11/20/15 1612 11/20/15 2158 11/21/15 0626  GLUCAP 247* 312* 185* 175* 227*

## 2015-11-21 NOTE — Progress Notes (Signed)
Pt did not want me to go over discharge teaching with him. Prescriptions given to patient.

## 2015-11-27 ENCOUNTER — Ambulatory Visit: Payer: Medicare (Managed Care) | Admitting: Podiatry

## 2015-12-03 NOTE — Telephone Encounter (Signed)
Please advise patient - he has 5 refills. Medication refill request is not necessary.

## 2015-12-06 ENCOUNTER — Encounter: Payer: Self-pay | Admitting: Physician Assistant

## 2015-12-09 ENCOUNTER — Ambulatory Visit (INDEPENDENT_AMBULATORY_CARE_PROVIDER_SITE_OTHER): Payer: Medicare Other | Admitting: Podiatry

## 2015-12-09 ENCOUNTER — Encounter: Payer: Self-pay | Admitting: Podiatry

## 2015-12-09 VITALS — BP 158/98 | HR 94 | Resp 18

## 2015-12-09 DIAGNOSIS — Q828 Other specified congenital malformations of skin: Secondary | ICD-10-CM | POA: Diagnosis not present

## 2015-12-09 DIAGNOSIS — E1149 Type 2 diabetes mellitus with other diabetic neurological complication: Secondary | ICD-10-CM | POA: Diagnosis not present

## 2015-12-09 MED ORDER — NONFORMULARY OR COMPOUNDED ITEM: Status: AC

## 2015-12-09 NOTE — Progress Notes (Signed)
   Subjective:    Patient ID: Devin Wells, male    DOB: [birth year], 55 y.o.   MRN: [id number]  HPI  55 year old male persists the office today for concerns of a painful lesion to the bottom of his left foot which is been ongoing for several years. He has seen other doctors for this and he states that he has not had any significant treatment or any resolution of symptoms. He also states he has constant burning pain to both of this. He is on gabapentin. He recently just moved from [place] and he was on a pain management program and he is awaiting a pain management referral.   He states his blood sugars run in 300s which is good for him. He states that typically runs in the 600s.  Review of Systems  All other systems reviewed and are negative.      Objective:   Physical Exam General: AAO x3, NAD  Dermatological: Annular hyperkeratotic lesion left foot submetatarsal 3. Upon debridement there is no underlying evidence of verruca there is no Ulceration. There is no drainage or pus or any surrounding erythema, ascending cellulitis. Small lesion on the right foot second metatarsal 3. No underlying ulceration. No other open lesions or pre-ulcer lesions.  Vascular: Dorsalis Pedis artery and Posterior Tibial artery pedal pulses are 2/4 bilateral with immedate capillary fill time.There is no pain with calf compression, swelling, warmth, erythema.   Neruologic: Sensation decreased with Dorann OuSimms Weinstein,, decreased vibratory sensation.  Musculoskeletal: Tenderness to lesion left foot submetatarsal 3. No other areas of tenderness. No pain, crepitus, or limitation noted with foot and ankle range of motion bilateral. Muscular strength 5/5 in all groups tested bilateral.  Gait: Unassisted, Nonantalgic.     Assessment & Plan:  Left second metatarsal 3 porokeratosis, neuropathy -Treatment options discussed including all alternatives, risks, and complications -Etiology of symptoms were discussed -Lesion  was debrided the left foot and right foot. Discussed reoccurrence. Offloading pads were dispensed. -Continue gabapentin for neuropathy. Awaiting pain management referral. I did order compound cream to help with this as well. -Discussed glucose control as well as follow-up with primary care. -Follow-up as needed or if any problems arise. In the meantime, encouraged to call the office with any questions, concerns, change in symptoms.   Ovid CurdMatthew Macayla Ekdahl, DPM

## 2015-12-09 NOTE — Addendum Note (Signed)
Addended by: Alphia Kava'CONNELL, Christy Ehrsam D on: 12/09/2015 02:14 PM   Modules accepted: Orders

## 2015-12-12 ENCOUNTER — Encounter: Payer: Medicare (Managed Care) | Admitting: Physician Assistant

## 2015-12-12 NOTE — Progress Notes (Signed)
Cardiology Office Note:    Date:  12/12/2015   ID:  Devin Wells, DOB 1961-05-28, MRN [id number]  PCP:  Rene Paci, MD  Cardiologist:    Electrophysiologist:    Referring MD: Newt Lukes, MD   No chief complaint on file.   History of Present Illness:     Devin Wells is a 55 y.o. male with a hx of Diabetes, HTN, HL. He had an episode of pancreatitis occurring in 2014. He was living in [place] but at the time was visiting his father in [place]. Records indicate he was hospitalized for >3 months in [place], [place]. His course was complicated by transient renal failure requiring dialysis.  Two days after discharge, he was readmitted with an acute MI treated with PCI with stenting 2 (details unavailable). He then went to stay with his son in [place] and developed a GI bleed. He was apparently admitted to [facility] for 3 months.  He recovered and eventually went back to [place].    He moved back to [place] in March 2017.    He was admitted 5/23-5/25 with CP.  CEs were neg.  He was seen by Dr. Verdis Prime for Cardiology.  Myoview was performed and demonstrated normal LAD territory perfusion.  There was inferolateral reduction in uptake that was nearly fixed with some peripheral ischemia.  Med Rx was recommended.  Echo demonstrated EF 45-50%.  If patient had refractory symptoms, he would need LHC to further evaluate.    He returns for post hospital FU.     Past Medical History  Diagnosis Date  . Hypertension   . Pancreatitis   . Anxiety   . Hyperlipidemia   . Coronary artery disease   . Diabetes with neurologic complications (HCC)     neuropathy; type 1  . Myocardial infarction acute (HCC) ~ 2013  . Transient renal failure ~ 2014    requiring dialysis /notes 11/19/2015  . Depression     Past Surgical History  Procedure Laterality Date  . Coronary angioplasty with stent placement    . Cholecystectomy open  ~ 2013  . Cystoscopy w/ stone manipulation       Current Medications: Outpatient Prescriptions Prior to Visit  Medication Sig Dispense Refill  . amitriptyline (ELAVIL) 25 MG tablet Take 1 tablet (25 mg total) by mouth at bedtime. 30 tablet 5  . carvedilol (COREG) 3.125 MG tablet Take 1 tablet (3.125 mg total) by mouth 2 (two) times daily. 30 tablet 0  . fenofibrate (TRICOR) 145 MG tablet Take 1 tablet (145 mg total) by mouth every morning. 30 tablet 0  . gabapentin (NEURONTIN) 300 MG capsule Take 3 capsules (900 mg total) by mouth 3 (three) times daily. 90 capsule 0  . LANTUS SOLOSTAR 100 UNIT/ML Solostar Pen Inject 70 Units into the skin daily at 10 pm. 15 mL 0  . NONFORMULARY OR COMPOUNDED ITEM [facility]:  Peripheral Neuropathy cream -Bupivacaine 1%, Doxepin 3%, Gabapentin 6%, Pentoxifylline 3%, Topiramate 1%, apply 1-2 grams to affected area 3-4 times daily. 120 each 3  . NOVOLOG FLEXPEN 100 UNIT/ML FlexPen Inject 20 Units into the skin 3 (three) times daily with meals. 15 mL 0  . oxyCODONE (ROXICODONE) 15 MG immediate release tablet Take 1 tablet (15 mg total) by mouth every 4 (four) hours as needed. 10 tablet 0  . pantoprazole (PROTONIX) 40 MG tablet Take 1 tablet (40 mg total) by mouth at bedtime. 30 tablet 0  . rosuvastatin (CRESTOR) 40 MG tablet Take  1 tablet (40 mg total) by mouth at bedtime. 30 tablet 0   No facility-administered medications prior to visit.      Allergies:   Review of patient's allergies indicates no known allergies.   Social History   Social History  . Marital Status: Divorced    Spouse Name: N/A  . Number of Children: N/A  . Years of Education: N/A   Social History Main Topics  . Smoking status: Current Every Day Smoker -- 1.00 packs/day for 43 years    Types: Cigarettes  . Smokeless tobacco: Never Used  . Alcohol Use: No  . Drug Use: Yes    Special: Marijuana     Comment: 11/20/2015 "nothing since I moved to [place] 2 months ago"  . Sexual Activity: Not Currently   Other Topics Concern  .  Not on file   Social History Narrative     Family History:  The patient's family history includes Alzheimer's disease in his mother; Diabetes Mellitus II in his father; Hypertension in his father.   ROS:   Please see the history of present illness.    ROS All other systems reviewed and are negative.   Physical Exam:    VS:  There were no vitals taken for this visit.   Physical Exam  Wt Readings from Last 3 Encounters:  11/19/15 195 lb (88.451 kg)  11/06/15 191 lb (86.637 kg)  10/23/15 190 lb (86.183 kg)      Studies/Labs Reviewed:     EKG:  EKG is  ordered today.  The ekg ordered today demonstrates   Recent Labs: 11/20/2015: ALT 19; BUN 14; Creatinine, Ser 0.90; Hemoglobin 11.7*; Platelets 126*; Potassium 3.6; Sodium 135   Recent Lipid Panel No results found for: CHOL, TRIG, HDL, CHOLHDL, VLDL, LDLCALC, LDLDIRECT  Additional studies/ records that were reviewed today include:    Echo 11/20/15 - Left ventricle: The cavity size was normal. Wall thickness was  normal. Systolic function was mildly reduced. The estimated  ejection fraction was in the range of 45% to 50%. There is  akinesis of the inferoseptal myocardium. Doppler parameters are  consistent with abnormal left ventricular relaxation (grade 1  diastolic dysfunction). - Mitral valve: There was mild regurgitation.  Myoview 11/21/15 Inferior, inferolateral ischemia with a possible component of scar, EF 49%, intermediate risk   ASSESSMENT:     No diagnosis found.  PLAN:     In order of problems listed above:  1.    Medication Adjustments/Labs and Tests Ordered: Current medicines are reviewed at length with the patient today.  Concerns regarding medicines are outlined above.  Medication changes, Labs and Tests ordered today are outlined in the Patient Instructions noted below. There are no Patient Instructions on file for this visit. Signed, Tereso NewcomerScott Weaver, PA-C  12/12/2015 1:55 PM    [facility] [address], [place], [place]  [address] Phone: [phone]; Fax: [phone]     This encounter was created in error - please disregard.

## 2015-12-16 ENCOUNTER — Emergency Department (HOSPITAL_COMMUNITY)
Admission: EM | Admit: 2015-12-16 | Discharge: 2015-12-16 | Disposition: A | Discharge status: Home / Self Care | Payer: Medicare Other | Attending: Emergency Medicine | Admitting: Emergency Medicine

## 2015-12-16 ENCOUNTER — Encounter (HOSPITAL_COMMUNITY): Payer: Self-pay | Admitting: Emergency Medicine

## 2015-12-16 DIAGNOSIS — F329 Major depressive disorder, single episode, unspecified: Secondary | ICD-10-CM | POA: Diagnosis not present

## 2015-12-16 DIAGNOSIS — G629 Polyneuropathy, unspecified: Secondary | ICD-10-CM | POA: Diagnosis not present

## 2015-12-16 DIAGNOSIS — I252 Old myocardial infarction: Secondary | ICD-10-CM | POA: Insufficient documentation

## 2015-12-16 DIAGNOSIS — M792 Neuralgia and neuritis, unspecified: Secondary | ICD-10-CM

## 2015-12-16 DIAGNOSIS — I2510 Atherosclerotic heart disease of native coronary artery without angina pectoris: Secondary | ICD-10-CM | POA: Insufficient documentation

## 2015-12-16 DIAGNOSIS — E785 Hyperlipidemia, unspecified: Secondary | ICD-10-CM | POA: Insufficient documentation

## 2015-12-16 DIAGNOSIS — F17210 Nicotine dependence, cigarettes, uncomplicated: Secondary | ICD-10-CM | POA: Diagnosis not present

## 2015-12-16 DIAGNOSIS — I10 Essential (primary) hypertension: Secondary | ICD-10-CM | POA: Insufficient documentation

## 2015-12-16 DIAGNOSIS — F1210 Cannabis abuse, uncomplicated: Secondary | ICD-10-CM | POA: Diagnosis not present

## 2015-12-16 DIAGNOSIS — E1149 Type 2 diabetes mellitus with other diabetic neurological complication: Secondary | ICD-10-CM | POA: Diagnosis not present

## 2015-12-16 MED ORDER — NAPROXEN 375 MG PO TABS: 375.0000 mg | ORAL_TABLET | Freq: Two times a day (BID) | ORAL | Status: AC

## 2015-12-16 MED ORDER — HYDROMORPHONE HCL 2 MG/ML IJ SOLN
2.0000 mg | Freq: Once | INTRAMUSCULAR | Status: AC
  Administered 2015-12-16: 2 mg via INTRAMUSCULAR
  Filled 2015-12-16: qty 1

## 2015-12-16 MED ORDER — KETOROLAC TROMETHAMINE 60 MG/2ML IM SOLN
60.0000 mg | Freq: Once | INTRAMUSCULAR | Status: AC
  Administered 2015-12-16: 60 mg via INTRAMUSCULAR
  Filled 2015-12-16: qty 2

## 2015-12-16 MED ORDER — BACLOFEN 20 MG PO TABS: 20.0000 mg | ORAL_TABLET | Freq: Three times a day (TID) | ORAL | Status: AC

## 2015-12-16 NOTE — ED Notes (Signed)
Pt reports 3 nights ago he began to have pain from his neuropathy.  States he takes gabapentin but has had no relief.  States his anxiety has also been heightened.  Pt fidgeting during assessment.

## 2015-12-16 NOTE — ED Provider Notes (Signed)
CSN: [id number]     Arrival date & time 12/16/15  1927 History   First MD Initiated Contact with Patient 12/16/15 2104     Chief Complaint  Patient presents with  . Peripheral Neuropathy  . Anxiety     (Consider location/radiation/quality/duration/timing/severity/associated sxs/prior Treatment) HPI  Devin Wells Is a 55 year old male with past medical history of diabetes, hypertension, coronary artery disease, depression, and peripheral neuropathy who presents emergency Department with chief complaint of severe leg pain. He has an appointment with his pain management physician, which is a new appointment as a new patient on July 11. Patient states that he has had worsening severe neuropathy that is keeping him awake at all hours. He is currently taking 15 mg oxycodone and gabapentin 900 mg 3 times a day with little relief. He shouldn't states "I just want a little bit of relief." He denies fevers, chills, myalgias or other signs of systemic infection. He is a current daily smoker. Past Medical History  Diagnosis Date  . Hypertension   . Pancreatitis   . Anxiety   . Hyperlipidemia   . Coronary artery disease   . Diabetes with neurologic complications (HCC)     neuropathy; type 1  . Myocardial infarction acute (HCC) ~ 2013  . Transient renal failure ~ 2014    requiring dialysis /notes 11/19/2015  . Depression    Past Surgical History  Procedure Laterality Date  . Coronary angioplasty with stent placement    . Cholecystectomy open  ~ 2013  . Cystoscopy w/ stone manipulation     Family History  Problem Relation Age of Onset  . Alzheimer's disease Mother   . Diabetes Mellitus II Father   . Hypertension Father    Social History  Substance Use Topics  . Smoking status: Current Every Day Smoker -- 1.00 packs/day for 43 years    Types: Cigarettes  . Smokeless tobacco: Never Used  . Alcohol Use: No    Review of Systems  Ten systems reviewed and are negative for acute change,  except as noted in the HPI.    Allergies  Review of patient's allergies indicates no known allergies.  Home Medications   Prior to Admission medications   Medication Sig Start Date End Date Taking? Authorizing Provider  carvedilol (COREG) 3.125 MG tablet Take 1 tablet (3.125 mg total) by mouth 2 (two) times daily. 10/23/15  Yes Jacalyn LefevreJulie Haviland, MD  fenofibrate (TRICOR) 145 MG tablet Take 1 tablet (145 mg total) by mouth every morning. Patient taking differently: Take 145 mg by mouth at bedtime.  10/23/15  Yes Jacalyn LefevreJulie Haviland, MD  gabapentin (NEURONTIN) 300 MG capsule Take 3 capsules (900 mg total) by mouth 3 (three) times daily. 10/23/15  Yes Jacalyn LefevreJulie Haviland, MD  ibuprofen (ADVIL,MOTRIN) 200 MG tablet Take 800 mg by mouth every 6 (six) hours as needed for moderate pain.   Yes Historical Provider, MD  LANTUS SOLOSTAR 100 UNIT/ML Solostar Pen Inject 70 Units into the skin daily at 10 pm. 10/23/15  Yes Jacalyn LefevreJulie Haviland, MD  NOVOLOG FLEXPEN 100 UNIT/ML FlexPen Inject 20 Units into the skin 3 (three) times daily with meals. Patient taking differently: Inject 12-20 Units into the skin 2 (two) times daily.  11/06/15  Yes Newt LukesValerie A Leschber, MD  oxyCODONE (ROXICODONE) 15 MG immediate release tablet Take 1 tablet (15 mg total) by mouth every 4 (four) hours as needed. 11/21/15  Yes Alison MurrayAlma M Devine, MD  pantoprazole (PROTONIX) 40 MG tablet Take 1 tablet (40 mg total) by  mouth at bedtime. 10/23/15  Yes Jacalyn Lefevre, MD  rosuvastatin (CRESTOR) 40 MG tablet Take 1 tablet (40 mg total) by mouth at bedtime. 10/23/15  Yes Jacalyn Lefevre, MD  amitriptyline (ELAVIL) 25 MG tablet Take 1 tablet (25 mg total) by mouth at bedtime. Patient not taking: Reported on 12/16/2015 11/06/15   Quentin Angst, MD  baclofen (LIORESAL) 20 MG tablet Take 1 tablet (20 mg total) by mouth 3 (three) times daily. 12/16/15   Arthor Captain, PA-C  naproxen (NAPROSYN) 375 MG tablet Take 1 tablet (375 mg total) by mouth 2 (two) times daily. 12/16/15    Arthor Captain, PA-C  NONFORMULARY OR COMPOUNDED ITEM [facility]:  Peripheral Neuropathy cream -Bupivacaine 1%, Doxepin 3%, Gabapentin 6%, Pentoxifylline 3%, Topiramate 1%, apply 1-2 grams to affected area 3-4 times daily. Patient not taking: Reported on 12/16/2015 12/09/15   Vivi Barrack, DPM   BP 150/98 mmHg  Pulse 73  Temp(Src) 98.4 F (36.9 C) (Oral)  Resp 18  Ht  (1.93 m)  Wt 90.719 kg  BMI 24.35 kg/m2  SpO2 100% Physical Exam  Constitutional: He appears well-developed and well-nourished. No distress.  HENT:  Head: Normocephalic and atraumatic.  Eyes: Conjunctivae are normal. No scleral icterus.  Neck: Normal range of motion. Neck supple.  Cardiovascular: Normal rate, regular rhythm and normal heart sounds.   Pulmonary/Chest: Effort normal and breath sounds normal. No respiratory distress.  Abdominal: Soft. There is no tenderness.  Musculoskeletal: He exhibits no edema.  Bilateral feet are extremely tender to palpation, warm and well perfused with strong DP and PT pulses. Normal DTRs, no signs of skin breakdown or infection.  Neurological: He is alert.  Skin: Skin is warm and dry. He is not diaphoretic.  Psychiatric: His behavior is normal.  Nursing note and vitals reviewed.   ED Course  Procedures (including critical care time) Labs Review Labs Reviewed - No data to display  Imaging Review No results found. I have personally reviewed and evaluated these images and lab results as part of my medical decision-making.   EKG Interpretation None      MDM   Final diagnoses:  Peripheral neuropathic pain (HCC)    Patient with acute on chronic peripheral neuropathy. Patient given IM Dilaudid 2 mg and IM Toradol. Patient will be discharged with baclofen and naproxen sodium. The patient appears reasonably screened and/or stabilized for discharge and I doubt any other medical condition or other [facility] requiring further screening, evaluation, or treatment in  the ED at this time prior to discharge.     Arthor Captain, PA-C 12/16/15 2220  Benjiman Core, MD 12/17/15 [phone]

## 2015-12-16 NOTE — Discharge Instructions (Signed)

## 2015-12-16 NOTE — ED Notes (Signed)
Patient d/c'd self care.  F/U and medications reviewed.  Patient verbalized understanding. 

## 2015-12-17 ENCOUNTER — Encounter (HOSPITAL_COMMUNITY): Payer: Self-pay | Admitting: Emergency Medicine

## 2015-12-17 DIAGNOSIS — I252 Old myocardial infarction: Secondary | ICD-10-CM | POA: Diagnosis not present

## 2015-12-17 DIAGNOSIS — I2510 Atherosclerotic heart disease of native coronary artery without angina pectoris: Secondary | ICD-10-CM | POA: Insufficient documentation

## 2015-12-17 DIAGNOSIS — F17210 Nicotine dependence, cigarettes, uncomplicated: Secondary | ICD-10-CM | POA: Diagnosis not present

## 2015-12-17 DIAGNOSIS — E1040 Type 1 diabetes mellitus with diabetic neuropathy, unspecified: Secondary | ICD-10-CM | POA: Diagnosis present

## 2015-12-17 DIAGNOSIS — I10 Essential (primary) hypertension: Secondary | ICD-10-CM | POA: Diagnosis not present

## 2015-12-17 NOTE — ED Notes (Signed)
Pt. reports pain /" shocks" at bilateral feet and lower legs for several days , seen at [facility] yesterday but did not fill his prescriptions .

## 2015-12-18 ENCOUNTER — Emergency Department (HOSPITAL_COMMUNITY)
Admission: EM | Admit: 2015-12-18 | Discharge: 2015-12-18 | Disposition: A | Discharge status: Home / Self Care | Payer: Medicare Other | Attending: Emergency Medicine | Admitting: Emergency Medicine

## 2015-12-18 DIAGNOSIS — G6289 Other specified polyneuropathies: Secondary | ICD-10-CM

## 2015-12-18 DIAGNOSIS — G8929 Other chronic pain: Secondary | ICD-10-CM

## 2015-12-18 LAB — COMPREHENSIVE METABOLIC PANEL
ALT: 28 U/L (ref 17–63)
ANION GAP: 7 (ref 5–15)
AST: 26 U/L (ref 15–41)
Albumin: 4 g/dL (ref 3.5–5.0)
Alkaline Phosphatase: 105 U/L (ref 38–126)
BUN: 12 mg/dL (ref 6–20)
CHLORIDE: 104 mmol/L (ref 101–111)
CO2: 27 mmol/L (ref 22–32)
Calcium: 9.9 mg/dL (ref 8.9–10.3)
Creatinine, Ser: 0.86 mg/dL (ref 0.61–1.24)
Glucose, Bld: 216 mg/dL — ABNORMAL HIGH (ref 65–99)
POTASSIUM: 3.6 mmol/L (ref 3.5–5.1)
Sodium: 138 mmol/L (ref 135–145)
Total Bilirubin: 0.7 mg/dL (ref 0.3–1.2)
Total Protein: 6.9 g/dL (ref 6.5–8.1)

## 2015-12-18 LAB — CBC WITH DIFFERENTIAL/PLATELET
BASOS ABS: 0 10*3/uL (ref 0.0–0.1)
BASOS PCT: 0 %
EOS ABS: 0.2 10*3/uL (ref 0.0–0.7)
Eosinophils Relative: 3 %
HCT: 40.2 % (ref 39.0–52.0)
HEMOGLOBIN: 13.3 g/dL (ref 13.0–17.0)
Lymphocytes Relative: 31 %
Lymphs Abs: 2.2 10*3/uL (ref 0.7–4.0)
MCH: 29.3 pg (ref 26.0–34.0)
MCHC: 33.1 g/dL (ref 30.0–36.0)
MCV: 88.5 fL (ref 78.0–100.0)
Monocytes Absolute: 0.5 10*3/uL (ref 0.1–1.0)
Monocytes Relative: 7 %
NEUTROS PCT: 59 %
Neutro Abs: 4.2 10*3/uL (ref 1.7–7.7)
PLATELETS: 186 10*3/uL (ref 150–400)
RBC: 4.54 MIL/uL (ref 4.22–5.81)
RDW: 14 % (ref 11.5–15.5)
WBC: 7.1 10*3/uL (ref 4.0–10.5)

## 2015-12-18 MED ORDER — HYDROMORPHONE HCL 1 MG/ML IJ SOLN
1.0000 mg | Freq: Once | INTRAMUSCULAR | Status: AC
  Administered 2015-12-18: 1 mg via INTRAMUSCULAR
  Filled 2015-12-18: qty 1

## 2015-12-18 NOTE — Discharge Instructions (Signed)
Chronic Pain °Chronic pain can be defined as pain that is off and on and lasts for 3-6 months or longer. Many things cause chronic pain, which can make it difficult to make a diagnosis. There are many treatment options available for chronic pain. However, finding a treatment that works well for you may require trying various approaches until the right one is found. Many people benefit from a combination of two or more types of treatment to control their pain. °SYMPTOMS  °Chronic pain can occur anywhere in the body and can range from mild to very severe. Some types of chronic pain include: °· Headache. °· Low back pain. °· Cancer pain. °· Arthritis pain. °· Neurogenic pain. This is pain resulting from damage to nerves. ° People with chronic pain may also have other symptoms such as: °· Depression. °· Anger. °· Insomnia. °· Anxiety. °DIAGNOSIS  °Your health care provider will help diagnose your condition over time. In many cases, the initial focus will be on excluding possible conditions that could be causing the pain. Depending on your symptoms, your health care provider may order tests to diagnose your condition. Some of these tests may include:  °· Blood tests.   °· CT scan.   °· MRI.   °· X-rays.   °· Ultrasounds.   °· Nerve conduction studies.   °You may need to see a specialist.  °TREATMENT  °Finding treatment that works well may take time. You may be referred to a pain specialist. He or she may prescribe medicine or therapies, such as:  °· Mindful meditation or yoga. °· Shots (injections) of numbing or pain-relieving medicines into the spine or area of pain. °· Local electrical stimulation. °· Acupuncture.   °· Massage therapy.   °· Aroma, color, light, or sound therapy.   °· Biofeedback.   °· Working with a physical therapist to keep from getting stiff.   °· Regular, gentle exercise.   °· Cognitive or behavioral therapy.   °· Group support.   °Sometimes, surgery may be recommended.  °HOME CARE INSTRUCTIONS   °· Take all medicines as directed by your health care provider.   °· Lessen stress in your life by relaxing and doing things such as listening to calming music.   °· Exercise or be active as directed by your health care provider.   °· Eat a healthy diet and include things such as vegetables, fruits, fish, and lean meats in your diet.   °· Keep all follow-up appointments with your health care provider.   °· Attend a support group with others suffering from chronic pain. °SEEK MEDICAL CARE IF:  °· Your pain gets worse.   °· You develop a new pain that was not there before.   °· You cannot tolerate medicines given to you by your health care provider.   °· You have new symptoms since your last visit with your health care provider.   °SEEK IMMEDIATE MEDICAL CARE IF:  °· You feel weak.   °· You have decreased sensation or numbness.   °· You lose control of bowel or bladder function.   °· Your pain suddenly gets much worse.   °· You develop shaking. °· You develop chills. °· You develop confusion. °· You develop chest pain. °· You develop shortness of breath.   °MAKE SURE YOU: °· Understand these instructions. °· Will watch your condition. °· Will get help right away if you are not doing well or get worse. °  °This information is not intended to replace advice given to you by your health care provider. Make sure you discuss any questions you have with your health care provider. °  °Document Released: 03/07/2002   Document Revised: 02/15/2013 Document Reviewed: 12/09/2012 °Elsevier Interactive Patient Education ©2016 [employer] °Peripheral Neuropathy °Peripheral neuropathy is a type of nerve damage. It affects nerves that carry signals between the spinal cord and other parts of the body. These are called peripheral nerves. With peripheral neuropathy, one nerve or a group of nerves may be damaged.  °CAUSES  °Many things can damage peripheral nerves. For some people with peripheral neuropathy, the cause is unknown. Some  causes include: °· Diabetes. This is the most common cause of peripheral neuropathy. °· Injury to a nerve. °· Pressure or stress on a nerve that lasts a long time. °· Too little vitamin B. Alcoholism can lead to this. °· Infections. °· Autoimmune diseases, such as multiple sclerosis and systemic lupus erythematosus. °· Inherited nerve diseases. °· Some medicines, such as cancer drugs. °· Toxic substances, such as lead and mercury. °· Too little blood flowing to the legs. °· Kidney disease. °· Thyroid disease. °SIGNS AND SYMPTOMS  °Different people have different symptoms. The symptoms you have will depend on which of your nerves is damaged.  Common symptoms include: °· Loss of feeling (numbness) in the feet and hands. °· Tingling in the feet and hands. °· Pain that burns. °· Very sensitive skin. °· Weakness. °· Not being able to move a part of the body (paralysis). °· Muscle twitching. °· Clumsiness or poor coordination. °· Loss of balance. °· Not being able to control your bladder. °· Feeling dizzy. °· Sexual problems. °DIAGNOSIS  °Peripheral neuropathy is a symptom, not a disease. Finding the cause of peripheral neuropathy can be hard. To figure that out, your health care provider will take a medical history and do a physical exam. A neurological exam will also be done. This involves checking things affected by your brain, spinal cord, and nerves (nervous system). For example, your health care provider will check your reflexes, how you move, and what you can feel.  °Other types of tests may also be ordered, such as: °· Blood tests. °· A test of the fluid in your spinal cord. °· Imaging tests, such as CT scans or an MRI. °· Electromyography (EMG). This test checks the nerves that control muscles. °· Nerve conduction velocity tests. These tests check how fast messages pass through your nerves. °· Nerve biopsy. A small piece of nerve is removed. It is then checked under a microscope. °TREATMENT  °· Medicine is often  used to treat peripheral neuropathy. Medicines may include: °¨ Pain-relieving medicines. Prescription or over-the-counter medicine may be suggested. °¨ Antiseizure medicine. This may be used for pain. °¨ Antidepressants. These also may help ease pain from neuropathy. °¨ Lidocaine. This is a numbing medicine. You might wear a patch or be given a shot. °¨ Mexiletine. This medicine is typically used to help control irregular heart rhythms. °· Surgery. Surgery may be needed to relieve pressure on a nerve or to destroy a nerve that is causing pain. °· Physical therapy to help movement. °· Assistive devices to help movement. °HOME CARE INSTRUCTIONS  °· Only take over-the-counter or prescription medicines as directed by your health care provider. Follow the instructions carefully for any given medicines. Do not take any other medicines without first getting approval from your health care provider. °· If you have diabetes, work closely with your health care provider to keep your blood sugar under control. °· If you have numbness in your feet: °¨ Check every day for signs of injury or infection. Watch for redness, warmth, and swelling. °¨ Wear   padded socks and comfortable shoes. These help protect your feet. °· Do not do things that put pressure on your damaged nerve. °· Do not smoke. Smoking keeps blood from getting to damaged nerves. °· Avoid or limit alcohol. Too much alcohol can cause a lack of B vitamins. These vitamins are needed for healthy nerves. °· Develop a good support system. Coping with peripheral neuropathy can be stressful. Talk to a mental health specialist or join a support group if you are struggling. °· Follow up with your health care provider as directed. °SEEK MEDICAL CARE IF:  °· You have new signs or symptoms of peripheral neuropathy. °· You are struggling emotionally from dealing with peripheral neuropathy. °· You have a fever. °SEEK IMMEDIATE MEDICAL CARE IF:  °· You have an injury or infection that  is not healing. °· You feel very dizzy or begin vomiting. °· You have chest pain. °· You have trouble breathing. °  °This information is not intended to replace advice given to you by your health care provider. Make sure you discuss any questions you have with your health care provider. °  °Document Released: 06/05/2002 Document Revised: 02/25/2011 Document Reviewed: 02/20/2013 °Elsevier Interactive Patient Education ©2016 [employer] Inc. ° °

## 2015-12-18 NOTE — ED Provider Notes (Signed)
CSN: [id number]     Arrival date & time 12/17/15  2305 History   First MD Initiated Contact with Patient 12/18/15 0209     Chief Complaint  Patient presents with  . Peripheral Neuropathy     (Consider location/radiation/quality/duration/timing/severity/associated sxs/prior Treatment) HPI Comments: Patient with a history of DM, HTN, CAD, depression and peripheral neuropathy presents with complaint of uncontrolled pain from neuropathy. No new symptoms. No fever, redness, fever, swelling, wound. He was seen in the emergency department last night and provided Rx's which he was unable to fill. He reports his son is in this hospital for surgery and he hasn't left in order to get to a pharmacy.   The history is provided by the patient. No language interpreter was used.    Past Medical History  Diagnosis Date  . Hypertension   . Pancreatitis   . Anxiety   . Hyperlipidemia   . Coronary artery disease   . Diabetes with neurologic complications (HCC)     neuropathy; type 1  . Myocardial infarction acute (HCC) ~ 2013  . Transient renal failure ~ 2014    requiring dialysis /notes 11/19/2015  . Depression    Past Surgical History  Procedure Laterality Date  . Coronary angioplasty with stent placement    . Cholecystectomy open  ~ 2013  . Cystoscopy w/ stone manipulation     Family History  Problem Relation Age of Onset  . Alzheimer's disease Mother   . Diabetes Mellitus II Father   . Hypertension Father    Social History  Substance Use Topics  . Smoking status: Current Every Day Smoker -- 1.00 packs/day for 43 years    Types: Cigarettes  . Smokeless tobacco: Never Used  . Alcohol Use: No    Review of Systems  Constitutional: Negative for fever and chills.  Musculoskeletal:       See HPI  Skin: Negative.  Negative for color change and wound.  Neurological: Negative.  Negative for weakness and numbness.      Allergies  Review of patient's allergies indicates no known  allergies.  Home Medications   Prior to Admission medications   Medication Sig Start Date End Date Taking? Authorizing Provider  baclofen (LIORESAL) 20 MG tablet Take 1 tablet (20 mg total) by mouth 3 (three) times daily. 12/16/15  Yes Arthor Captain, PA-C  carvedilol (COREG) 3.125 MG tablet Take 1 tablet (3.125 mg total) by mouth 2 (two) times daily. 10/23/15  Yes Jacalyn Lefevre, MD  fenofibrate (TRICOR) 145 MG tablet Take 1 tablet (145 mg total) by mouth every morning. Patient taking differently: Take 145 mg by mouth at bedtime.  10/23/15  Yes Jacalyn Lefevre, MD  gabapentin (NEURONTIN) 300 MG capsule Take 3 capsules (900 mg total) by mouth 3 (three) times daily. 10/23/15  Yes Jacalyn Lefevre, MD  ibuprofen (ADVIL,MOTRIN) 200 MG tablet Take 800 mg by mouth every 6 (six) hours as needed for moderate pain.   Yes Historical Provider, MD  LANTUS SOLOSTAR 100 UNIT/ML Solostar Pen Inject 70 Units into the skin daily at 10 pm. 10/23/15  Yes Jacalyn Lefevre, MD  NOVOLOG FLEXPEN 100 UNIT/ML FlexPen Inject 20 Units into the skin 3 (three) times daily with meals. Patient taking differently: Inject 12-20 Units into the skin 2 (two) times daily.  11/06/15  Yes Newt Lukes, MD  pantoprazole (PROTONIX) 40 MG tablet Take 1 tablet (40 mg total) by mouth at bedtime. 10/23/15  Yes Jacalyn Lefevre, MD  rosuvastatin (CRESTOR) 40 MG tablet Take  1 tablet (40 mg total) by mouth at bedtime. 10/23/15  Yes Jacalyn Lefevre, MD  amitriptyline (ELAVIL) 25 MG tablet Take 1 tablet (25 mg total) by mouth at bedtime. Patient not taking: Reported on 12/16/2015 11/06/15   Quentin Angst, MD  naproxen (NAPROSYN) 375 MG tablet Take 1 tablet (375 mg total) by mouth 2 (two) times daily. 12/16/15   Arthor Captain, PA-C  NONFORMULARY OR COMPOUNDED ITEM [facility]:  Peripheral Neuropathy cream -Bupivacaine 1%, Doxepin 3%, Gabapentin 6%, Pentoxifylline 3%, Topiramate 1%, apply 1-2 grams to affected area 3-4 times daily. Patient not  taking: Reported on 12/16/2015 12/09/15   Vivi Barrack, DPM  oxyCODONE (ROXICODONE) 15 MG immediate release tablet Take 1 tablet (15 mg total) by mouth every 4 (four) hours as needed. Patient not taking: Reported on 12/18/2015 11/21/15   Alison Murray, MD   BP 158/93 mmHg  Pulse 74  Temp(Src) 98.5 F (36.9 C) (Oral)  Resp 16  Ht  (1.93 m)  Wt 88.905 kg  BMI 23.87 kg/m2  SpO2 100% Physical Exam  Constitutional: He is oriented to person, place, and time. He appears well-developed and well-nourished.  Neck: Normal range of motion.  Cardiovascular: Intact distal pulses.   Pulmonary/Chest: Effort normal.  Musculoskeletal: Normal range of motion.  No lumbar tenderness. FROM bilateral LE's. Plantar surface bilateral feet tender and are not swollen, discolored, and have no wounds or skin breakdown.   Neurological: He is alert and oriented to person, place, and time.  Skin: Skin is warm and dry.  Psychiatric: He has a normal mood and affect.    ED Course  Procedures (including critical care time) Labs Review Labs Reviewed  COMPREHENSIVE METABOLIC PANEL - Abnormal; Notable for the following:    Glucose, Bld 216 (*)    All other components within normal limits  CBC WITH DIFFERENTIAL/PLATELET   Results for orders placed or performed during the hospital encounter of 12/18/15  CBC with Differential  Result Value Ref Range   WBC 7.1 4.0 - 10.5 K/uL   RBC 4.54 4.22 - 5.81 MIL/uL   Hemoglobin 13.3 13.0 - 17.0 g/dL   HCT 16.1 09.6 - 04.5 %   MCV 88.5 78.0 - 100.0 fL   MCH 29.3 26.0 - 34.0 pg   MCHC 33.1 30.0 - 36.0 g/dL   RDW 40.9 81.1 - 91.4 %   Platelets 186 150 - 400 K/uL   Neutrophils Relative % 59 %   Neutro Abs 4.2 1.7 - 7.7 K/uL   Lymphocytes Relative 31 %   Lymphs Abs 2.2 0.7 - 4.0 K/uL   Monocytes Relative 7 %   Monocytes Absolute 0.5 0.1 - 1.0 K/uL   Eosinophils Relative 3 %   Eosinophils Absolute 0.2 0.0 - 0.7 K/uL   Basophils Relative 0 %   Basophils Absolute  0.0 0.0 - 0.1 K/uL  Comprehensive metabolic panel  Result Value Ref Range   Sodium 138 135 - 145 mmol/L   Potassium 3.6 3.5 - 5.1 mmol/L   Chloride 104 101 - 111 mmol/L   CO2 27 22 - 32 mmol/L   Glucose, Bld 216 (H) 65 - 99 mg/dL   BUN 12 6 - 20 mg/dL   Creatinine, Ser 7.82 0.61 - 1.24 mg/dL   Calcium 9.9 8.9 - 95.6 mg/dL   Total Protein 6.9 6.5 - 8.1 g/dL   Albumin 4.0 3.5 - 5.0 g/dL   AST 26 15 - 41 U/L   ALT 28 17 - 63 U/L  Alkaline Phosphatase 105 38 - 126 U/L   Total Bilirubin 0.7 0.3 - 1.2 mg/dL   GFR calc non Af Amer >60 >60 mL/min   GFR calc Af Amer >60 >60 mL/min   Anion gap 7 5 - 15    Imaging Review No results found. I have personally reviewed and evaluated these images and lab results as part of my medical decision-making.   EKG Interpretation None      MDM   Final diagnoses:  None    1. Chronic pain  The patient presents for chronic pain in LE's secondary to peripheral neuropathy. No new conditions present. Pain will be addressed. He is to follow up with PCP [facility], Dr. Felicity CoyerLeschber) ASAP for routine management.     Elpidio AnisShari Akaysha Cobern, PA-C 12/18/15 [id number]  Gilda Creasehristopher J Pollina, MD 12/18/15 [phone]

## 2016-01-06 ENCOUNTER — Telehealth: Payer: Self-pay | Admitting: Internal Medicine

## 2016-01-06 MED ORDER — NOVOLOG FLEXPEN 100 UNIT/ML ~~LOC~~ SOPN: 20.0000 [IU] | PEN_INJECTOR | Freq: Three times a day (TID) | SUBCUTANEOUS | Status: DC

## 2016-01-06 MED ORDER — LANTUS SOLOSTAR 100 UNIT/ML ~~LOC~~ SOPN
70.0000 [IU] | PEN_INJECTOR | Freq: Every day | SUBCUTANEOUS | Status: DC
Start: 2016-01-06 — End: 2016-01-09

## 2016-01-06 NOTE — Telephone Encounter (Signed)
Refilled Lantus and Novolog - patient must have visit here for any further refills or [place] [facility]

## 2016-01-06 NOTE — Telephone Encounter (Signed)
Pt requesting a refill of his insulin   Pt moved to [place] and would [facility] [address], [place], [phone] Thank you

## 2016-01-09 ENCOUNTER — Telehealth: Payer: Self-pay | Admitting: Internal Medicine

## 2016-01-09 MED ORDER — NOVOLOG FLEXPEN 100 UNIT/ML ~~LOC~~ SOPN: 20.0000 [IU] | PEN_INJECTOR | Freq: Three times a day (TID) | SUBCUTANEOUS | Status: AC

## 2016-01-09 MED ORDER — LANTUS SOLOSTAR 100 UNIT/ML ~~LOC~~ SOPN: 70.0000 [IU] | PEN_INJECTOR | Freq: Every day | SUBCUTANEOUS | Status: AC

## 2016-01-09 NOTE — Telephone Encounter (Signed)
[facility] [place]

## 2016-01-09 NOTE — Telephone Encounter (Signed)
Pt [facility] [address], [place], [place] [id number] Pt was informed about another office visit needed for more refills or for [place] to [place] [facility] Thank you

## 2016-01-10 ENCOUNTER — Other Ambulatory Visit: Payer: Self-pay | Admitting: Internal Medicine

## 2016-01-10 NOTE — Telephone Encounter (Signed)
MA spoke with Harrold DonathNathan [facility] [place]. MA gave a verbal based on the scripts printed for Novolog and Lantus. Patient is aware of needing an OV for further refills.

## 2016-01-10 NOTE — Telephone Encounter (Signed)
Pt. called stating that he went to [place] pharmacy [place] and they do not have his insulin. PT. Was told that it was refilled yesterday. Please f/u with pt.

## 2016-01-15 ENCOUNTER — Encounter: Payer: Self-pay | Admitting: Physician Assistant

## 2018-03-04 IMAGING — CR DG CHEST 2V
2 series · 2 of 2 positions shown · non-contrast
Comparison: Chest radiograph from 10/23/2015

CLINICAL DATA: Acute onset of generalized chest and back pain.
Initial encounter.

EXAM:
CHEST  2 VIEW

[chest pa]
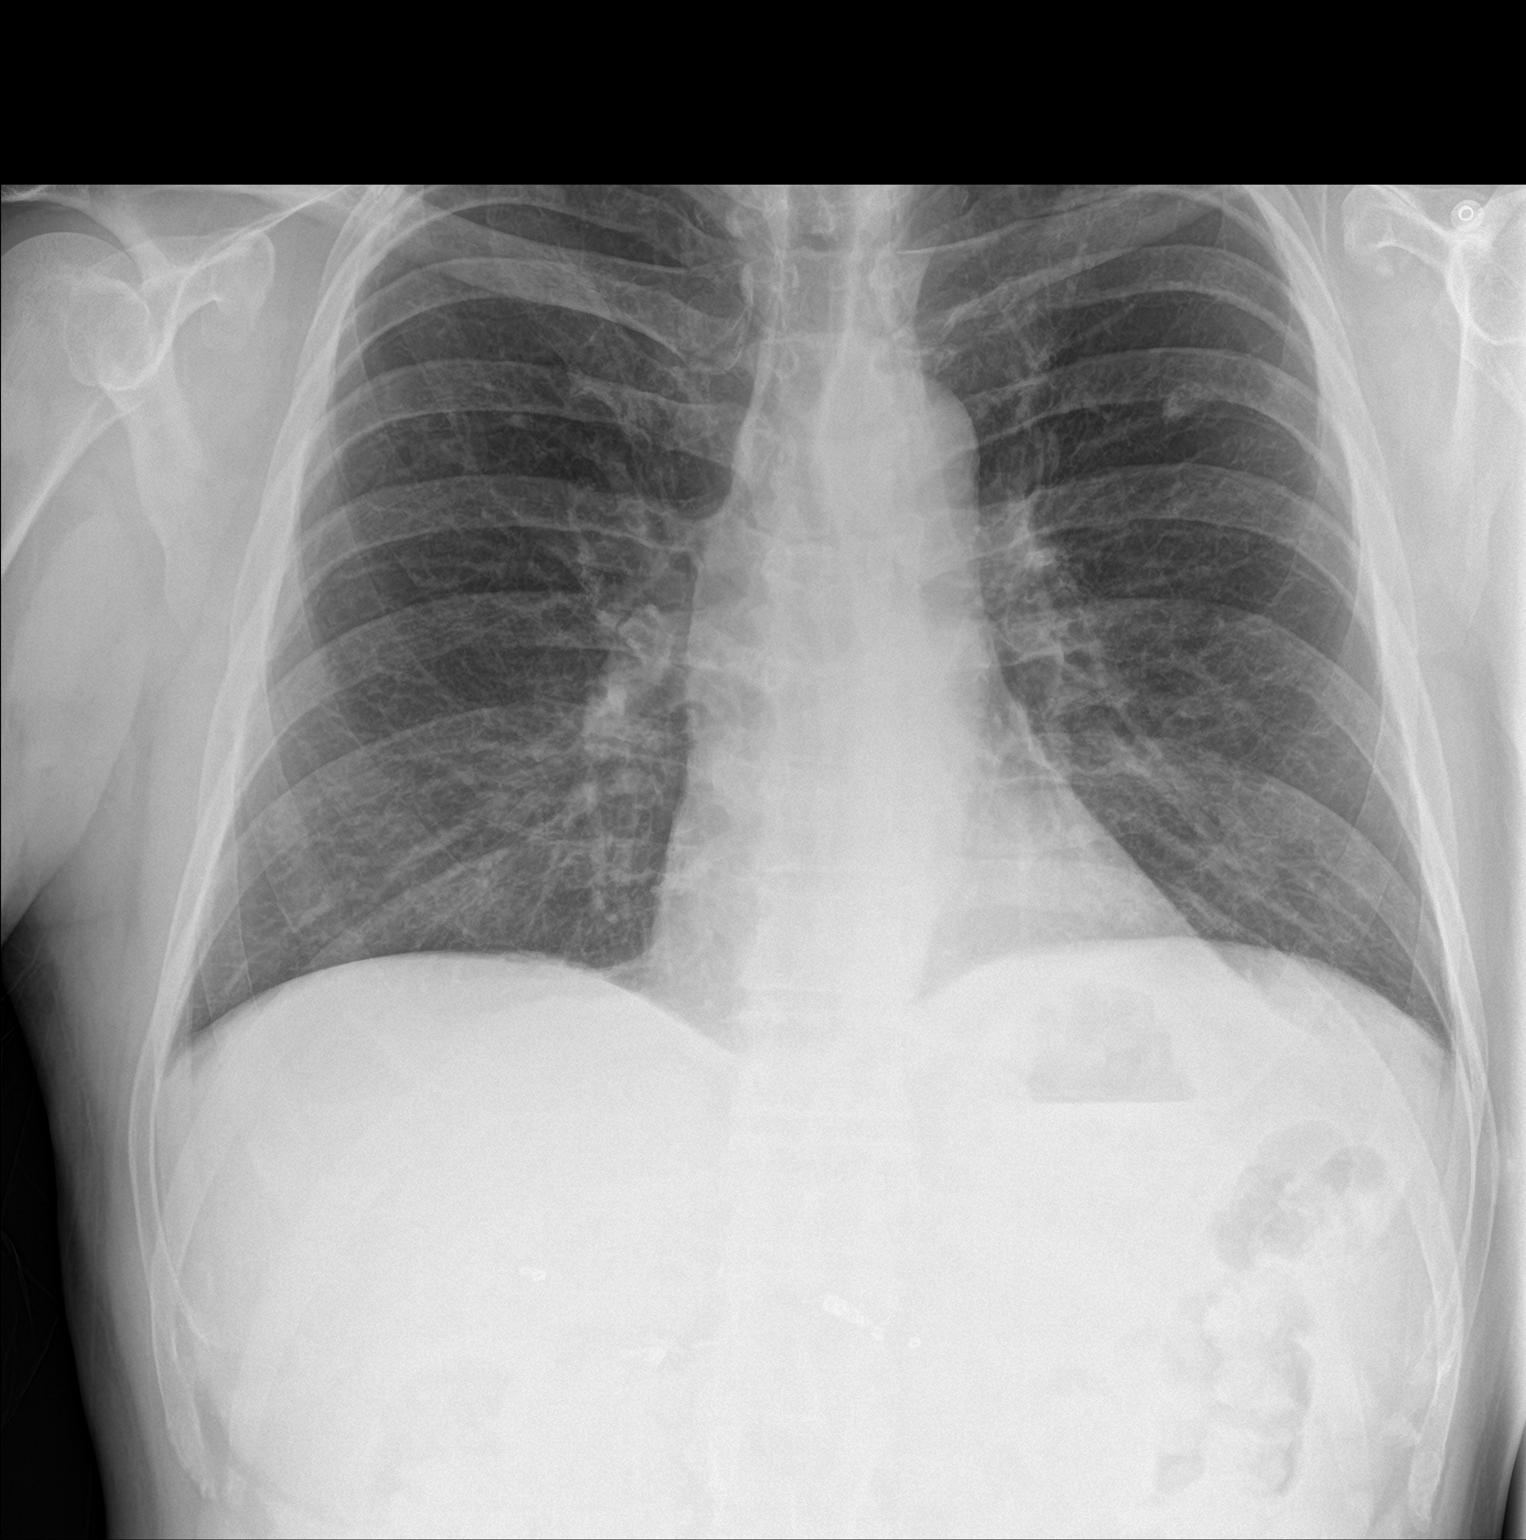

[chest lat]
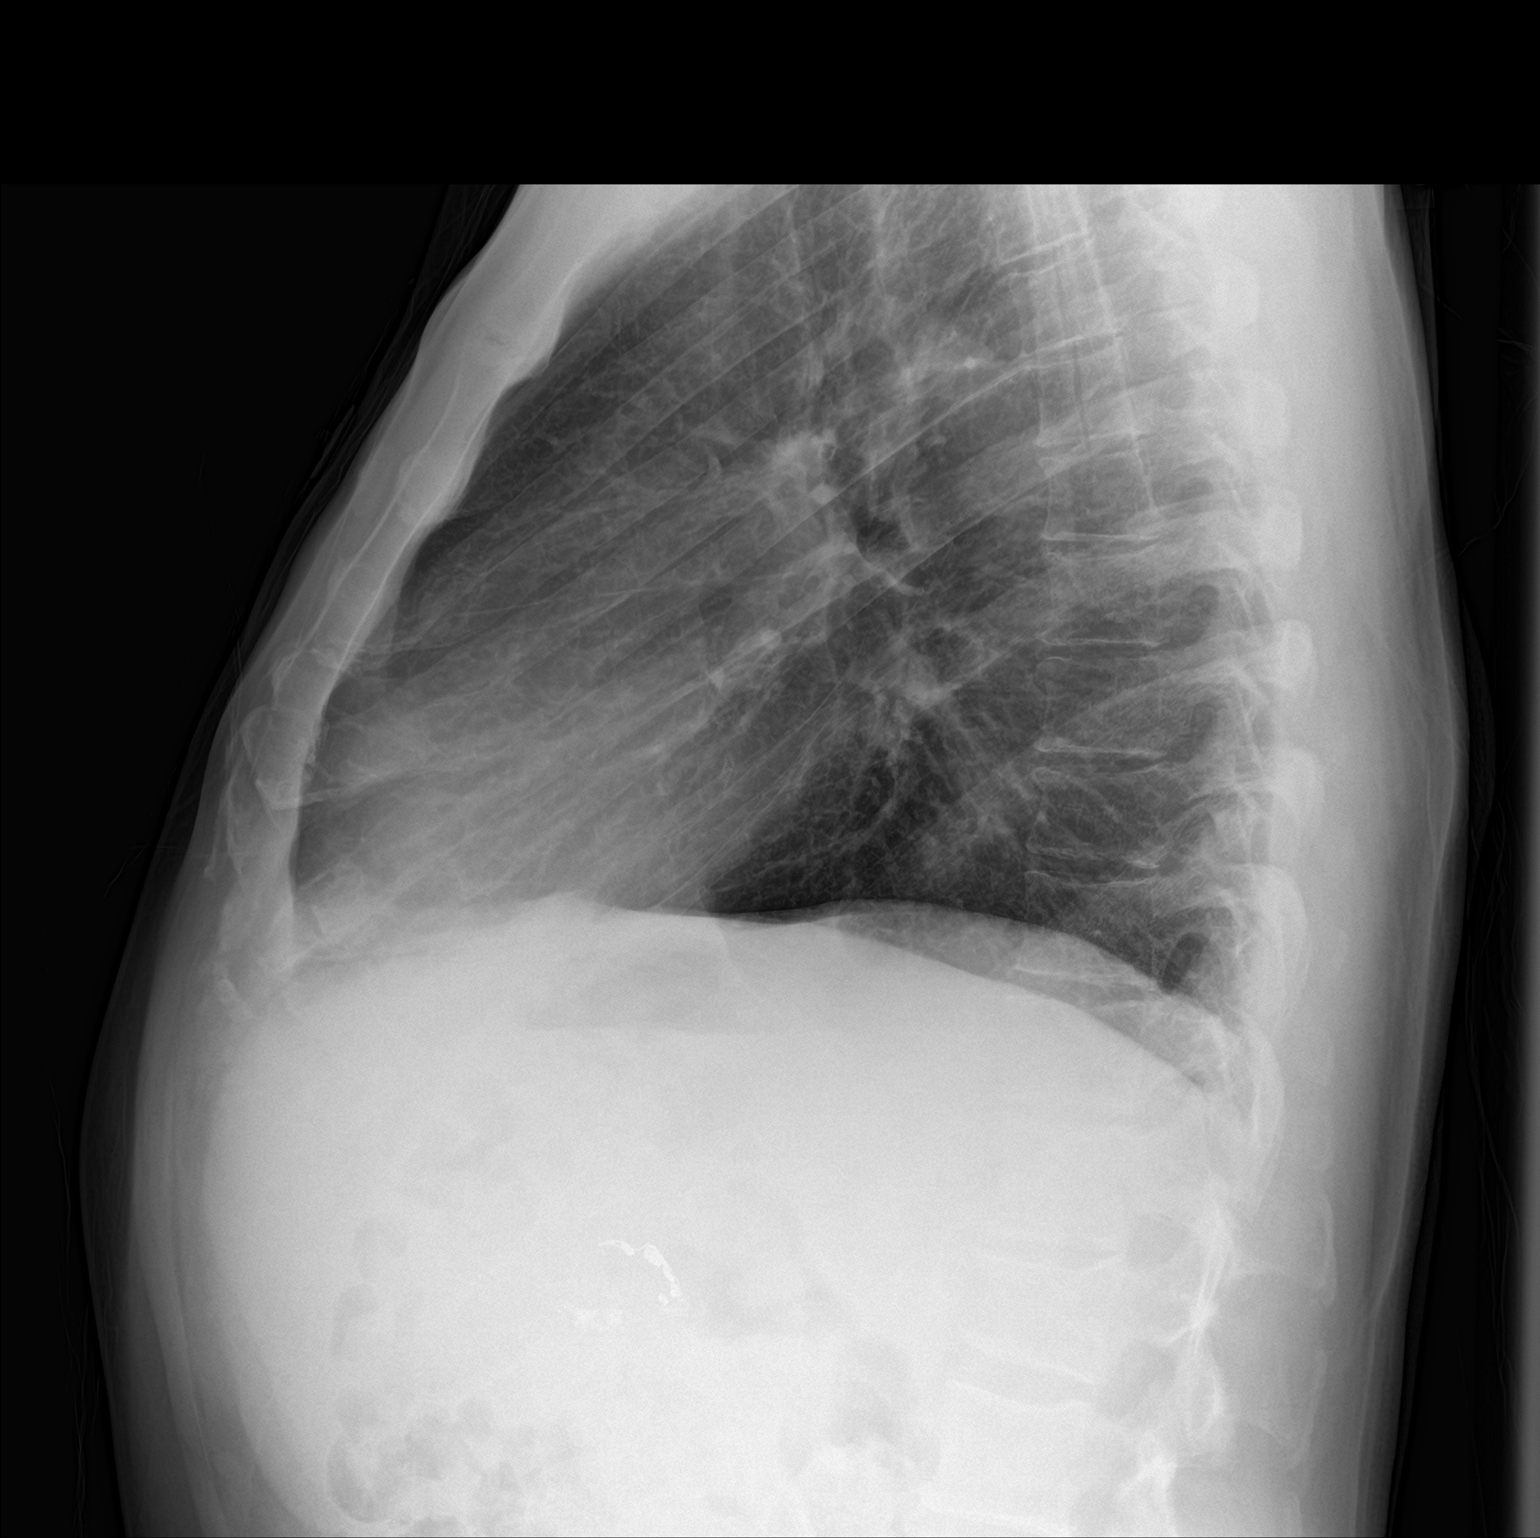

[2 of 2 positions shown; findings below may reference images not displayed]

FINDINGS: The lungs are well-aerated. A calcified granuloma is noted at the
left midlung zone. There is no evidence of focal opacification,
pleural effusion or pneumothorax.

The heart is normal in size; the mediastinal contour is within
normal limits. No acute osseous abnormalities are seen.
Postoperative change is noted about the upper abdomen.
IMPRESSION: No acute cardiopulmonary process seen.
